# Patient Record
Sex: Female | Born: 1995 | Race: White | Hispanic: No | Marital: Single | Smoking: Never smoker
Health system: Southern US, Community
[De-identification: ages and names within clinical notes are randomized; demographics above are authoritative.]

## PROBLEM LIST (undated history)

## (undated) DIAGNOSIS — K900 Celiac disease: Secondary | ICD-10-CM

## (undated) DIAGNOSIS — E282 Polycystic ovarian syndrome: Secondary | ICD-10-CM

---
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
## 2015-01-03 DIAGNOSIS — R946 Abnormal results of thyroid function studies: Secondary | ICD-10-CM | POA: Insufficient documentation

## 2015-01-03 DIAGNOSIS — E559 Vitamin D deficiency, unspecified: Secondary | ICD-10-CM | POA: Insufficient documentation

## 2015-04-14 ENCOUNTER — Emergency Department
Admission: EM | Admit: 2015-04-14 | Discharge: 2015-04-14 | Disposition: A | Discharge status: Home / Self Care | Payer: BLUE CROSS/BLUE SHIELD | Attending: Emergency Medicine | Admitting: Emergency Medicine

## 2015-04-14 ENCOUNTER — Emergency Department: Payer: BLUE CROSS/BLUE SHIELD

## 2015-04-14 DIAGNOSIS — R1110 Vomiting, unspecified: Secondary | ICD-10-CM | POA: Insufficient documentation

## 2015-04-14 DIAGNOSIS — Z79899 Other long term (current) drug therapy: Secondary | ICD-10-CM | POA: Insufficient documentation

## 2015-04-14 DIAGNOSIS — R102 Pelvic and perineal pain: Secondary | ICD-10-CM | POA: Insufficient documentation

## 2015-04-14 DIAGNOSIS — Z791 Long term (current) use of non-steroidal anti-inflammatories (NSAID): Secondary | ICD-10-CM | POA: Diagnosis not present

## 2015-04-14 DIAGNOSIS — Z3202 Encounter for pregnancy test, result negative: Secondary | ICD-10-CM | POA: Diagnosis not present

## 2015-04-14 DIAGNOSIS — G8929 Other chronic pain: Secondary | ICD-10-CM | POA: Insufficient documentation

## 2015-04-14 DIAGNOSIS — R1030 Lower abdominal pain, unspecified: Secondary | ICD-10-CM | POA: Diagnosis present

## 2015-04-14 LAB — URINALYSIS COMPLETE WITH MICROSCOPIC (ARMC ONLY)
BILIRUBIN URINE: NEGATIVE
Glucose, UA: NEGATIVE mg/dL
HGB URINE DIPSTICK: NEGATIVE
KETONES UR: NEGATIVE mg/dL
LEUKOCYTES UA: NEGATIVE
Nitrite: NEGATIVE
Protein, ur: NEGATIVE mg/dL
RBC / HPF: NONE SEEN RBC/hpf (ref 0–5)
SPECIFIC GRAVITY, URINE: 1.023 (ref 1.005–1.030)
pH: 5 (ref 5.0–8.0)

## 2015-04-14 LAB — COMPREHENSIVE METABOLIC PANEL
ALBUMIN: 4.1 g/dL (ref 3.5–5.0)
ALK PHOS: 50 U/L (ref 38–126)
ALT: 14 U/L (ref 14–54)
AST: 14 U/L — AB (ref 15–41)
Anion gap: 6 (ref 5–15)
BILIRUBIN TOTAL: 0.4 mg/dL (ref 0.3–1.2)
BUN: 13 mg/dL (ref 6–20)
CALCIUM: 9.5 mg/dL (ref 8.9–10.3)
CO2: 25 mmol/L (ref 22–32)
Chloride: 110 mmol/L (ref 101–111)
Creatinine, Ser: 0.61 mg/dL (ref 0.44–1.00)
GFR calc Af Amer: >60 mL/min (ref >60)
GFR calc non Af Amer: >60 mL/min (ref >60)
GLUCOSE: 95 mg/dL (ref 65–99)
POTASSIUM: 3.6 mmol/L (ref 3.5–5.1)
SODIUM: 141 mmol/L (ref 135–145)
Total Protein: 7.5 g/dL (ref 6.5–8.1)

## 2015-04-14 LAB — CBC WITH DIFFERENTIAL/PLATELET
BASOS PCT: 1 %
Basophils Absolute: 0 10*3/uL (ref 0–0.1)
EOS PCT: 2 %
Eosinophils Absolute: 0.1 10*3/uL (ref 0–0.7)
HEMATOCRIT: 40.6 % (ref 35.0–47.0)
HEMOGLOBIN: 13.6 g/dL (ref 12.0–16.0)
LYMPHS PCT: 52 %
Lymphs Abs: 2.3 10*3/uL (ref 1.0–3.6)
MCH: 29.4 pg (ref 26.0–34.0)
MCHC: 33.5 g/dL (ref 32.0–36.0)
MCV: 87.9 fL (ref 80.0–100.0)
MONO ABS: 0.3 10*3/uL (ref 0.2–0.9)
MONOS PCT: 7 %
Neutro Abs: 1.7 10*3/uL (ref 1.4–6.5)
Neutrophils Relative %: 38 %
Platelets: 194 10*3/uL (ref 150–440)
RBC: 4.62 MIL/uL (ref 3.80–5.20)
RDW: 12.1 % (ref 11.5–14.5)
WBC: 4.6 10*3/uL (ref 3.6–11.0)

## 2015-04-14 LAB — WET PREP, GENITAL
Clue Cells Wet Prep HPF POC: NONE SEEN
Trich, Wet Prep: NONE SEEN
Yeast Wet Prep HPF POC: NONE SEEN

## 2015-04-14 LAB — CHLAMYDIA/NGC RT PCR (ARMC ONLY)
CHLAMYDIA TR: NOT DETECTED
N GONORRHOEAE: NOT DETECTED

## 2015-04-14 LAB — POCT PREGNANCY, URINE: Preg Test, Ur: NEGATIVE

## 2015-04-14 LAB — HCG, QUANTITATIVE, PREGNANCY

## 2015-04-14 LAB — LIPASE, BLOOD: LIPASE: 21 U/L — AB (ref 22–51)

## 2015-04-14 MED ORDER — MORPHINE SULFATE (PF) 2 MG/ML IV SOLN
2.0000 mg | Freq: Once | INTRAVENOUS | Status: AC
  Administered 2015-04-14: 2 mg via INTRAVENOUS
  Filled 2015-04-14: qty 1

## 2015-04-14 MED ORDER — OXYCODONE-ACETAMINOPHEN 5-325 MG PO TABS
1.0000 | ORAL_TABLET | Freq: Once | ORAL | Status: AC
  Administered 2015-04-14: 1 via ORAL
  Filled 2015-04-14: qty 1

## 2015-04-14 NOTE — Discharge Instructions (Signed)
We do noticed that you have reassuring blood work and ultrasound today. There is no evidence of acute surgical emergency. If, however, things worsen including increased pain, vomiting, fever, chills, or you feel worse in any significant way we ask you  to come back to the emergency department right away. The ultrasound findings I have included below; it is unclear exactly what this signifies, radiology is requesting that you have a repeat ultrasound in about 6 weeks. Please follow closely with OB/GYN for further evaluation. There is nothing to suggest that this is what is causing your pain.  Abnormal echogenic material in the cervical canal and endometrium of the lower uterine segment, without internal Doppler flow, probably representing blood clot. Urine pregnancy test today was negative, accordingly I doubt that this represents a spontaneous abortion in progress. Polyp is also a less likely differential diagnostic consideration given the elongated and somewhat irregular appearance and lack of internal flow. If the patient experiences abnormal uterine bleeding or other continued gynecologic symptoms despite conservative therapy, then reimaging of the pelvis in 6 weeks time may be warranted for reassessment.

## 2015-04-14 NOTE — ED Notes (Signed)
Pt resting in bed quietly, friend at bedside, respirations even and unlabored

## 2015-04-14 NOTE — ED Notes (Addendum)
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

## 2015-04-14 NOTE — ED Provider Notes (Addendum)
[facility] Emergency Department Provider Note  ____________________________________________   I have reviewed the triage vital signs and the nursing notes.   HISTORY  Chief Complaint Abdominal Pain    HPI Lori Beck is a 19 y.o. female with a history of chronic abdominal pain. Patient gets her care here as well as at various hospitals in [place]. She has a history of celiac disease, and polycystic ovarian syndrome and chronic recurrent abdominal pain. She has had, according to notes, negative CT scan of the abdomen this year, negative CT of the abdomen 2013, negative recent HIDA scan negative recent gallbladder ultrasound, multiple other negative tests for her chronic abdominal pain. She states that today she had some vomiting and lower abdominal pain. The pain is similar to her prior pain. She does have history of PCO as but she has not had any significant ovarian cysts she states because she was put on birth control after was detected "early". 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 Patient has a very medical history capillary and states that she does take "MicroBid postcoitally" to forestall UTIs. She has no urinary tract symptoms at this time. She denies presence event cannot totally when her last menstrual period was because of her birth control. Pain this time started yesterday, gradual in onset, she did vomit several times, she noticed a "trace" of red blood in her vomit once. She denies vaginal discharge or STI exposure. She is driving but she states that we give her pain medication she will arrange a ride home and she understands she must not drive after pain medication here    No past medical history on file.  There are no active problems to display for this patient.   No past surgical history on file.  Current Outpatient Rx  Name  Route  Sig  Dispense  Refill  . DM-Doxylamine-Acetaminophen (NYQUIL COLD & FLU PO)   Oral   Take 15  mLs by mouth at bedtime as needed (cough).         Marland Kitchen. HYDROcodone-acetaminophen (NORCO/VICODIN) 5-325 MG per tablet   Oral   Take 1 tablet by mouth every 4 (four) hours as needed.   10 tablet   0   . naproxen (NAPROSYN) 500 MG tablet   Oral   Take 1 tablet (500 mg total) by mouth 2 (two) times daily.   30 tablet   0   . Norethin Ace-Eth Estrad-FE (GILDESS 24 FE PO)   Oral   Take 1 tablet by mouth at bedtime.           Allergies Review of patient's allergies indicates no known allergies.  No family history on file.  Social History Social History  Substance Use Topics  . Smoking status: Never Smoker   . Smokeless tobacco: Not on file  . Alcohol Use: Yes    Review of Systems Constitutional: No fever/chills Eyes: No visual changes. ENT: No sore throat. No stiff neck no neck pain Cardiovascular: Denies chest pain. Respiratory: Denies shortness of breath. Gastrointestinal:   Positive vomiting.  No diarrhea.  No constipation. Genitourinary: Negative for dysuria. Musculoskeletal: Negative lower extremity swelling Skin: Negative for rash. Neurological: Negative for headaches, focal weakness or numbness. 10-point ROS otherwise negative.  ____________________________________________   PHYSICAL EXAM:  VITAL SIGNS: ED Triage Vitals  Enc Vitals Group     BP 04/14/15 0818 115/65 mmHg     Pulse Rate 04/14/15 0818 84     Resp 04/14/15 0818 18  Temp 04/14/15 0818 98.3 F (36.8 C)     Temp Source 04/14/15 0818 Oral     SpO2 04/14/15 0818 98 %     Weight 04/14/15 0818 160 lb (72.576 kg)     Height 04/14/15 0818  (1.727 m)     Head Cir --      Peak Flow --      Pain Score 04/14/15 0823 8     Pain Loc --      Pain Edu? --      Excl. in GC? --     Constitutional: Alert and oriented. Well appearing and in no acute distress. Using her telephone and smiling with no evidence of discomfort Eyes: Conjunctivae are normal. PERRL. EOMI. Head: Atraumatic. Nose: No  congestion/rhinnorhea. Mouth/Throat: Mucous membranes are moist.  Oropharynx non-erythematous. Neck: No stridor.   Nontender with no meningismus Cardiovascular: Normal rate, regular rhythm. Grossly normal heart sounds.  Good peripheral circulation. Respiratory: Normal respiratory effort.  No retractions. Lungs CTAB. Gastrointestinal: There is distractible trace suprapubic discomfort with soft abdomen good bowel sounds No distention. No guarding no rebound Back:  There is no focal tenderness or step off there is no midline tenderness there are no lesions noted. there is no CVA tenderness Pelvic exam: Female nurse chaperone present, no external lesions noted, physiologic vaginal discharge noted with no purulent discharge, no cervical motion tenderness, no adnexal masses, there is left-sided adnexal discomfort , there is no significant uterine tenderness or mass. No vaginal bleeding Musculoskeletal: No lower extremity tenderness. No joint effusions, no DVT signs strong distal pulses no edema Neurologic:  Normal speech and language. No gross focal neurologic deficits are appreciated.  Skin:  Skin is warm, dry and intact. No rash noted. Psychiatric: Mood and affect are normal. Speech and behavior are normal.  ____________________________________________   LABS (all labs ordered are listed, but only abnormal results are displayed)  Labs Reviewed  WET PREP, GENITAL  CHLAMYDIA/NGC RT PCR ([facility] ONLY)  CBC WITH DIFFERENTIAL/PLATELET  COMPREHENSIVE METABOLIC PANEL  LIPASE, BLOOD  URINALYSIS COMPLETEWITH MICROSCOPIC ([facility] ONLY)  POC URINE PREG, ED  POCT PREGNANCY, URINE   ____________________________________________  EKG   ____________________________________________  RADIOLOGY   ____________________________________________   PROCEDURES  Procedure(s) performed: None  Critical Care performed: None  ____________________________________________   INITIAL IMPRESSION /  ASSESSMENT AND PLAN / ED COURSE  Pertinent labs & imaging results that were available during my care of the patient were reviewed by me and considered in my medical decision making (see chart for details).  Patient very well-appearing with chronic recurrent abdominal pain despite extensive imaging and testing. We will do a pelvic exam because of her history of PCO as although have low suspicion for torsion or significant ruptured cyst given her history and abdominal exam. We will obtain blood work and urine and urine pregnancy, I will give her a small dose of pain medication as she is quite comfortable in appearance and will reassess. ____________________________________________   FINAL CLINICAL IMPRESSION(S) / ED DIAGNOSES  Final diagnoses:  None   ----------------------------------------- 12:12 PM on 04/14/2015 -----------------------------------------  Patient remains in no acute distress, serial abdominal exams are benign workup is unremarkable patient eager to go home tolerating by mouth the patient was made aware of the findings on ultrasound the exact character wrist active which I am not sure how to describe. It looks like she may be beginning her period although I saw no evidence on bimanual or speculum exam. There is no evidence of STI  or PID. I have advised her that she needs to follow-up for these findings and I have given her information about the findings today take with her to see OB. 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 The patient has no complaints at this time and is eager to go. The patient's questions were answered and follow-up instructions and return precautions given and understood. I did check a serum Quant to ensure that this was not a miscarriage and it is negative. No evidence of torsion or PCO S  ----------------------------------------- 12:45 PM on 04/14/2015 -----------------------------------------  Patient  requesting pain meds prior to go encase the pain gets worse again. I will give her one here but I do not wish to mask any pathology and therefore will hold off on setting her home with narcotic prescription. Serial abdominal exams which are no evidence of acute appendicitis or other intra-abdominal pathology but she understands she must come back if she feels worse  Jeanmarie Plant, MD 04/14/15 2841  Jeanmarie Plant, MD 04/14/15 3244  Jeanmarie Plant, MD 04/14/15 1213  Jeanmarie Plant, MD 04/14/15 1245

## 2015-04-14 NOTE — ED Notes (Signed)
Pt reports abdominal pain x 1 day. Pt reports multiple episodes of emesis with blood in emesis this am. Pt reports LLQ abdominal pain

## 2015-04-14 NOTE — ED Notes (Signed)
Patient asking for pain med at this time will nurse.

## 2015-04-14 NOTE — ED Notes (Signed)
Patient transported to Ultrasound 

## 2015-06-08 ENCOUNTER — Emergency Department: Payer: BLUE CROSS/BLUE SHIELD

## 2015-06-08 ENCOUNTER — Emergency Department
Admission: EM | Admit: 2015-06-08 | Discharge: 2015-06-08 | Disposition: A | Discharge status: Home / Self Care | Payer: BLUE CROSS/BLUE SHIELD | Attending: Emergency Medicine | Admitting: Emergency Medicine

## 2015-06-08 ENCOUNTER — Encounter: Payer: Self-pay | Admitting: Emergency Medicine

## 2015-06-08 DIAGNOSIS — E860 Dehydration: Secondary | ICD-10-CM | POA: Diagnosis not present

## 2015-06-08 DIAGNOSIS — Z3202 Encounter for pregnancy test, result negative: Secondary | ICD-10-CM | POA: Diagnosis not present

## 2015-06-08 DIAGNOSIS — J019 Acute sinusitis, unspecified: Secondary | ICD-10-CM | POA: Diagnosis not present

## 2015-06-08 DIAGNOSIS — L293 Anogenital pruritus, unspecified: Secondary | ICD-10-CM | POA: Diagnosis not present

## 2015-06-08 DIAGNOSIS — R531 Weakness: Secondary | ICD-10-CM | POA: Insufficient documentation

## 2015-06-08 DIAGNOSIS — R55 Syncope and collapse: Secondary | ICD-10-CM | POA: Diagnosis present

## 2015-06-08 HISTORY — DX: Celiac disease: K90.0

## 2015-06-08 HISTORY — DX: Polycystic ovarian syndrome: E28.2

## 2015-06-08 LAB — BASIC METABOLIC PANEL
ANION GAP: 6 (ref 5–15)
BUN: 13 mg/dL (ref 6–20)
CALCIUM: 8.9 mg/dL (ref 8.9–10.3)
CO2: 24 mmol/L (ref 22–32)
Chloride: 108 mmol/L (ref 101–111)
Creatinine, Ser: 0.71 mg/dL (ref 0.44–1.00)
Glucose, Bld: 100 mg/dL — ABNORMAL HIGH (ref 65–99)
POTASSIUM: 3.3 mmol/L — AB (ref 3.5–5.1)
Sodium: 138 mmol/L (ref 135–145)

## 2015-06-08 LAB — URINALYSIS COMPLETE WITH MICROSCOPIC (ARMC ONLY)
Bilirubin Urine: NEGATIVE
Glucose, UA: NEGATIVE mg/dL
Hgb urine dipstick: NEGATIVE
Nitrite: NEGATIVE
Protein, ur: 30 mg/dL — AB
Specific Gravity, Urine: 1.033 — ABNORMAL HIGH (ref 1.005–1.030)
pH: 5 (ref 5.0–8.0)

## 2015-06-08 LAB — CBC
HCT: 37.8 % (ref 35.0–47.0)
Hemoglobin: 13.1 g/dL (ref 12.0–16.0)
MCH: 30.8 pg (ref 26.0–34.0)
MCHC: 34.6 g/dL (ref 32.0–36.0)
MCV: 89 fL (ref 80.0–100.0)
Platelets: 223 10*3/uL (ref 150–440)
RBC: 4.24 MIL/uL (ref 3.80–5.20)
RDW: 13 % (ref 11.5–14.5)
WBC: 7.6 10*3/uL (ref 3.6–11.0)

## 2015-06-08 LAB — POCT PREGNANCY, URINE: Preg Test, Ur: NEGATIVE

## 2015-06-08 MED ORDER — FLUCONAZOLE 150 MG PO TABS: 150.0000 mg | ORAL_TABLET | Freq: Every day | ORAL | Status: AC

## 2015-06-08 MED ORDER — AMOXICILLIN-POT CLAVULANATE 875-125 MG PO TABS: 1.0000 | ORAL_TABLET | Freq: Two times a day (BID) | ORAL | Status: AC

## 2015-06-08 MED ORDER — SODIUM CHLORIDE 0.9 % IV SOLN
Freq: Once | INTRAVENOUS | Status: AC
  Administered 2015-06-08: 08:00:00 via INTRAVENOUS

## 2015-06-08 NOTE — ED Notes (Addendum)
Patient ambulatory to triage with steady gait, without difficulty or distress noted; "I feel like my immune system shut down over night; I passed out last night for a minute then went to sleep; awoke at 3am to go to bathroom and woke up on bathroom floor; got a yeast infection, chills, weak, can't walk or talk real well, fever, cold symptoms; not having any visceral pain just like dehydration"

## 2015-06-08 NOTE — ED Provider Notes (Addendum)
[facility] Emergency Department Provider Note     Time seen: ----------------------------------------- 7:15 AM on 06/08/2015 -----------------------------------------    I have reviewed the triage vital signs and the nursing notes.   HISTORY  Chief Complaint Loss of Consciousness and Weakness    HPI Lori Beck is a 19 y.o. female who presents to ER stating she passed out or almost passed out last night several times. She has described vaginal itching, chills, weakness, cough and cold symptoms, facial pressure. Patient feels like she's been eating and drinking normally, denies a history of this in the past.Nothing makes her symptoms better or worse currently.   Past Medical History  Diagnosis Date  . Celiac disease   . Polycystic ovarian disease     There are no active problems to display for this patient.   History reviewed. No pertinent past surgical history.  Allergies Review of patient's allergies indicates no known allergies.  Social History Social History  Substance Use Topics  . Smoking status: Never Smoker   . Smokeless tobacco: None  . Alcohol Use: Yes    Review of Systems Constitutional: Positive for fever Eyes: Negative for visual changes. ENT: Negative for sore throat. Positive for facial pressure and congestion Cardiovascular: Negative for chest pain. Respiratory: Negative for shortness of breath. Positive for cough Gastrointestinal: Negative for abdominal pain, vomiting and diarrhea. Genitourinary: Negative for dysuria. Musculoskeletal: Negative for back pain. Skin: Negative for rash. Neurological: Negative for headaches, positive for weakness  10-point ROS otherwise negative.  ____________________________________________   PHYSICAL EXAM:  VITAL SIGNS: ED Triage Vitals  Enc Vitals Group     BP 06/08/15 0520 148/78 mmHg     Pulse Rate 06/08/15 0520 88     Resp 06/08/15 0520 18     Temp 06/08/15 0520 98 F  (36.7 C)     Temp Source 06/08/15 0520 Oral     SpO2 06/08/15 0520 98 %     Weight 06/08/15 0520 160 lb (72.576 kg)     Height 06/08/15 0520 5\' 8"  (1.727 m)     Head Cir --      Peak Flow --      Pain Score --      Pain Loc --      Pain Edu? --      Excl. in GC? --     Constitutional: Alert and oriented. Well appearing and in no distress. Eyes: Conjunctivae are normal. PERRL. Normal extraocular movements. ENT   Head: Normocephalic and atraumatic. There is tenderness over frontal and maxillary sinuses.   Nose: Mild congestion   Mouth/Throat: Mucous membranes are moist.   Neck: No stridor. Cardiovascular: Normal rate, regular rhythm. Normal and symmetric distal pulses are present in all extremities. No murmurs, rubs, or gallops. Respiratory: Normal respiratory effort without tachypnea nor retractions. Breath sounds are clear and equal bilaterally. No wheezes/rales/rhonchi. Gastrointestinal: Soft and nontender. No distention. No abdominal bruits.  Musculoskeletal: Nontender with normal range of motion in all extremities. No joint effusions.  No lower extremity tenderness nor edema. Neurologic:  Normal speech and language. No gross focal neurologic deficits are appreciated. Speech is normal. No gait instability. Skin:  Skin is warm, dry and intact. No rash noted. Psychiatric: Mood and affect are normal. Speech and behavior are normal. Patient exhibits appropriate insight and judgment. ____________________________________________  EKG: Interpreted by me. Normal sinus rhythm with a rate of 77 bpm, normal PR interval, normal QRS with, normal QT interval. Normal axis.  ____________________________________________  ED COURSE:  Pertinent labs & imaging results that were available during my care of the patient were reviewed by me and considered in my medical decision making (see chart for details). Patient's acute distress, will check basic labs give fluids and  reevaluate. ____________________________________________    LABS (pertinent positives/negatives)  Labs Reviewed  BASIC METABOLIC PANEL - Abnormal; Notable for the following:    Potassium 3.3 (*)    Glucose, Bld 100 (*)    All other components within normal limits  URINALYSIS COMPLETEWITH MICROSCOPIC ([facility] ONLY) - Abnormal; Notable for the following:    Color, Urine YELLOW (*)    APPearance HAZY (*)    Ketones, ur TRACE (*)    Specific Gravity, Urine 1.033 (*)    Protein, ur 30 (*)    Leukocytes, UA 2+ (*)    Bacteria, UA FEW (*)    Squamous Epithelial / LPF 6-30 (*)    All other components within normal limits  CBC  POC URINE PREG, ED  POCT PREGNANCY, URINE    RADIOLOGY  Chest x-ray IMPRESSION: Suggestion of hyperinflation and increased interstitial markings suggesting viral/ atypical respiratory infection. No pleural effusion. ____________________________________________  FINAL ASSESSMENT AND PLAN  Weakness, sinusitis  Plan: Patient with labs and imaging as dictated above. Patient likely mildly dehydrated. She received saline here, she'll be discharged with antibiotics for sinus infection. She stable for outpatient follow-up the next 2 days for reevaluation.   Emily Filbert, MD   Emily Filbert, MD 06/08/15 [id number]  Emily Filbert, MD 06/08/15 [phone]

## 2015-06-08 NOTE — Discharge Instructions (Signed)
Dehydration, Adult °Dehydration is a condition in which you do not have enough fluid or water in your body. It happens when you take in less fluid than you lose. Vital organs such as the kidneys, brain, and heart cannot function without a proper amount of fluids. Any loss of fluids from the body can cause dehydration.  °Dehydration can range from mild to severe. This condition should be treated right away to help prevent it from becoming severe. °CAUSES  °This condition may be caused by: °· Vomiting. °· Diarrhea. °· Excessive sweating, such as when exercising in hot or humid weather. °· Not drinking enough fluid during strenuous exercise or during an illness. °· Excessive urine output. °· Fever. °· Certain medicines. °RISK FACTORS °This condition is more likely to develop in: °· People who are taking certain medicines that cause the body to lose excess fluid (diuretics).   °· People who have a chronic illness, such as diabetes, that may increase urination. °· Older adults.   °· People who live at high altitudes.   °· People who participate in endurance sports.   °SYMPTOMS  °Mild Dehydration °· Thirst. °· Dry lips. °· Slightly dry mouth. °· Dry, warm skin. °Moderate Dehydration °· Very dry mouth.   °· Muscle cramps.   °· Dark urine and decreased urine production.   °· Decreased tear production.   °· Headache.   °· Light-headedness, especially when you stand up from a sitting position.   °Severe Dehydration °· Changes in skin.   °· Cold and clammy skin.   °· Skin does not spring back quickly when lightly pinched and released.   °· Changes in body fluids.   °· Extreme thirst.   °· No tears.   °· Not able to sweat when body temperature is high, such as in hot weather.   °· Minimal urine production.   °· Changes in vital signs.   °· Rapid, weak pulse (more than 100 beats per minute when you are sitting still).   °· Rapid breathing.   °· Low blood pressure.   °· Other changes.   °· Sunken eyes.   °· Cold hands and feet.    °· Confusion. °· Lethargy and difficulty being awakened. °· Fainting (syncope).   °· Short-term weight loss.   °· Unconsciousness. °DIAGNOSIS  °This condition may be diagnosed based on your symptoms. You may also have tests to determine how severe your dehydration is. These tests may include:  °· Urine tests.   °· Blood tests.   °TREATMENT  °Treatment for this condition depends on the severity. Mild or moderate dehydration can often be treated at home. Treatment should be started right away. Do not wait until dehydration becomes severe. Severe dehydration needs to be treated at the hospital. °Treatment for Mild Dehydration °· Drinking plenty of water to replace the fluid you have lost.   °· Replacing minerals in your blood (electrolytes) that you may have lost.   °Treatment for Moderate Dehydration  °· Consuming oral rehydration solution (ORS). °Treatment for Severe Dehydration °· Receiving fluid through an IV tube.   °· Receiving electrolyte solution through a feeding tube that is passed through your nose and into your stomach (nasogastric tube or NG tube). °· Correcting any abnormalities in electrolytes. °HOME CARE INSTRUCTIONS  °· Drink enough fluid to keep your urine clear or pale yellow.   °· Drink water or fluid slowly by taking small sips. You can also try sucking on ice cubes.  °· Have food or beverages that contain electrolytes. Examples include bananas and sports drinks. °· Take over-the-counter and prescription medicines only as told by your health care provider.   °· Prepare ORS according to the manufacturer's instructions. Take sips   of ORS every 5 minutes until your urine returns to normal. °· If you have vomiting or diarrhea, continue to try to drink water, ORS, or both.   °· If you have diarrhea, avoid:   °¨ Beverages that contain caffeine.   °¨ Fruit juice.   °¨ Milk.    °¨ Carbonated soft drinks. °· Do not take salt tablets. This can lead to the condition of having too much sodium in your body  (hypernatremia).   °SEEK MEDICAL CARE IF: °· You cannot eat or drink without vomiting. °· You have had moderate diarrhea during a period of more than 24 hours. °· You have a fever. °SEEK IMMEDIATE MEDICAL CARE IF:  °· You have extreme thirst. °· You have severe diarrhea. °· You have not urinated in 6-8 hours, or you have urinated only a small amount of very dark urine. °· You have shriveled skin. °· You are dizzy, confused, or both. °  °This information is not intended to replace advice given to you by your health care provider. Make sure you discuss any questions you have with your health care provider. °  °Document Released: 06/19/2005 Document Revised: 03/10/2015 Document Reviewed: 11/04/2014 °Elsevier Interactive Patient Education ©2016 [employer] °Sinusitis, Adult °Sinusitis is redness, soreness, and inflammation of the paranasal sinuses. Paranasal sinuses are air pockets within the bones of your face. They are located beneath your eyes, in the middle of your forehead, and above your eyes. In healthy paranasal sinuses, mucus is able to drain out, and air is able to circulate through them by way of your nose. However, when your paranasal sinuses are inflamed, mucus and air can become trapped. This can allow bacteria and other germs to grow and cause infection. °Sinusitis can develop quickly and last only a short time (acute) or continue over a long period (chronic). Sinusitis that lasts for more than 12 weeks is considered chronic. °CAUSES °Causes of sinusitis include: °· Allergies. °· Structural abnormalities, such as displacement of the cartilage that separates your nostrils (deviated septum), which can decrease the air flow through your nose and sinuses and affect sinus drainage. °· Functional abnormalities, such as when the small hairs (cilia) that line your sinuses and help remove mucus do not work properly or are not present. °SIGNS AND SYMPTOMS °Symptoms of acute and chronic sinusitis are the same. The  primary symptoms are pain and pressure around the affected sinuses. Other symptoms include: °· Upper toothache. °· Earache. °· Headache. °· Bad breath. °· Decreased sense of smell and taste. °· A cough, which worsens when you are lying flat. °· Fatigue. °· Fever. °· Thick drainage from your nose, which often is green and may contain pus (purulent). °· Swelling and warmth over the affected sinuses. °DIAGNOSIS °Your health care provider will perform a physical exam. During your exam, your health care provider may perform any of the following to help determine if you have acute sinusitis or chronic sinusitis: °· Look in your nose for signs of abnormal growths in your nostrils (nasal polyps). °· Tap over the affected sinus to check for signs of infection. °· View the inside of your sinuses using an imaging device that has a light attached (endoscope). °If your health care provider suspects that you have chronic sinusitis, one or more of the following tests may be recommended: °· Allergy tests. °· Nasal culture. A sample of mucus is taken from your nose, sent to a lab, and screened for bacteria. °· Nasal cytology. A sample of mucus is taken from your nose and examined by   your health care provider to determine if your sinusitis is related to an allergy. °TREATMENT °Most cases of acute sinusitis are related to a viral infection and will resolve on their own within 10 days. Sometimes, medicines are prescribed to help relieve symptoms of both acute and chronic sinusitis. These may include pain medicines, decongestants, nasal steroid sprays, or saline sprays. °However, for sinusitis related to a bacterial infection, your health care provider will prescribe antibiotic medicines. These are medicines that will help kill the bacteria causing the infection. °Rarely, sinusitis is caused by a fungal infection. In these cases, your health care provider will prescribe antifungal medicine. °For some cases of chronic sinusitis, surgery  is needed. Generally, these are cases in which sinusitis recurs more than 3 times per year, despite other treatments. °HOME CARE INSTRUCTIONS °· Drink plenty of water. Water helps thin the mucus so your sinuses can drain more easily. °· Use a humidifier. °· Inhale steam 3-4 times a day (for example, sit in the bathroom with the shower running). °· Apply a warm, moist washcloth to your face 3-4 times a day, or as directed by your health care provider. °· Use saline nasal sprays to help moisten and clean your sinuses. °· Take medicines only as directed by your health care provider. °· If you were prescribed either an antibiotic or antifungal medicine, finish it all even if you start to feel better. °SEEK IMMEDIATE MEDICAL CARE IF: °· You have increasing pain or severe headaches. °· You have nausea, vomiting, or drowsiness. °· You have swelling around your face. °· You have vision problems. °· You have a stiff neck. °· You have difficulty breathing. °  °This information is not intended to replace advice given to you by your health care provider. Make sure you discuss any questions you have with your health care provider. °  °Document Released: 06/19/2005 Document Revised: 07/10/2014 Document Reviewed: 07/04/2011 °Elsevier Interactive Patient Education ©2016 [employer] Inc. ° °

## 2015-11-06 ENCOUNTER — Encounter: Payer: Self-pay | Admitting: Emergency Medicine

## 2015-11-06 ENCOUNTER — Emergency Department
Admission: EM | Admit: 2015-11-06 | Discharge: 2015-11-06 | Disposition: A | Discharge status: Home / Self Care | Payer: BLUE CROSS/BLUE SHIELD | Attending: Student | Admitting: Student

## 2015-11-06 DIAGNOSIS — N390 Urinary tract infection, site not specified: Secondary | ICD-10-CM | POA: Diagnosis not present

## 2015-11-06 DIAGNOSIS — R112 Nausea with vomiting, unspecified: Secondary | ICD-10-CM | POA: Diagnosis present

## 2015-11-06 DIAGNOSIS — N760 Acute vaginitis: Secondary | ICD-10-CM | POA: Insufficient documentation

## 2015-11-06 LAB — COMPREHENSIVE METABOLIC PANEL
ALK PHOS: 46 U/L (ref 38–126)
ALT: 14 U/L (ref 14–54)
ANION GAP: 8 (ref 5–15)
AST: 15 U/L (ref 15–41)
Albumin: 3.8 g/dL (ref 3.5–5.0)
BUN: 15 mg/dL (ref 6–20)
CALCIUM: 9.2 mg/dL (ref 8.9–10.3)
CO2: 26 mmol/L (ref 22–32)
Chloride: 106 mmol/L (ref 101–111)
Creatinine, Ser: 0.59 mg/dL (ref 0.44–1.00)
GFR calc Af Amer: >60 mL/min (ref >60)
GFR calc non Af Amer: >60 mL/min (ref >60)
GLUCOSE: 99 mg/dL (ref 65–99)
Potassium: 3.7 mmol/L (ref 3.5–5.1)
Sodium: 140 mmol/L (ref 135–145)
Total Bilirubin: 0.7 mg/dL (ref 0.3–1.2)
Total Protein: 6.9 g/dL (ref 6.5–8.1)

## 2015-11-06 LAB — URINALYSIS COMPLETE WITH MICROSCOPIC (ARMC ONLY)
Bilirubin Urine: NEGATIVE
Glucose, UA: NEGATIVE mg/dL
Hgb urine dipstick: NEGATIVE
KETONES UR: NEGATIVE mg/dL
NITRITE: NEGATIVE
PH: 5 (ref 5.0–8.0)
PROTEIN: 30 mg/dL — AB
SPECIFIC GRAVITY, URINE: 1.028 (ref 1.005–1.030)

## 2015-11-06 LAB — CBC WITH DIFFERENTIAL/PLATELET
Basophils Absolute: 0.1 10*3/uL (ref 0–0.1)
Basophils Relative: 1 %
EOS PCT: 2 %
Eosinophils Absolute: 0.1 10*3/uL (ref 0–0.7)
HCT: 38.5 % (ref 35.0–47.0)
Hemoglobin: 13 g/dL (ref 12.0–16.0)
LYMPHS ABS: 2.3 10*3/uL (ref 1.0–3.6)
LYMPHS PCT: 32 %
MCH: 30.3 pg (ref 26.0–34.0)
MCHC: 33.7 g/dL (ref 32.0–36.0)
MCV: 89.9 fL (ref 80.0–100.0)
MONO ABS: 0.5 10*3/uL (ref 0.2–0.9)
MONOS PCT: 6 %
Neutro Abs: 4.2 10*3/uL (ref 1.4–6.5)
Neutrophils Relative %: 59 %
PLATELETS: 235 10*3/uL (ref 150–440)
RBC: 4.28 MIL/uL (ref 3.80–5.20)
RDW: 12.7 % (ref 11.5–14.5)
WBC: 7.1 10*3/uL (ref 3.6–11.0)

## 2015-11-06 LAB — POCT PREGNANCY, URINE: Preg Test, Ur: NEGATIVE

## 2015-11-06 LAB — WET PREP, GENITAL
Clue Cells Wet Prep HPF POC: NONE SEEN
SPERM: NONE SEEN
Trich, Wet Prep: NONE SEEN
YEAST WET PREP: NONE SEEN

## 2015-11-06 LAB — CHLAMYDIA/NGC RT PCR (ARMC ONLY)
Chlamydia Tr: NOT DETECTED
N GONORRHOEAE: NOT DETECTED

## 2015-11-06 LAB — LIPASE, BLOOD: Lipase: 18 U/L (ref 11–51)

## 2015-11-06 MED ORDER — ONDANSETRON 4 MG PO TBDP: 4.0000 mg | ORAL_TABLET | Freq: Three times a day (TID) | ORAL | Status: AC | PRN

## 2015-11-06 MED ORDER — OXYCODONE-ACETAMINOPHEN 5-325 MG PO TABS
1.0000 | ORAL_TABLET | Freq: Once | ORAL | Status: AC
  Administered 2015-11-06: 1 via ORAL
  Filled 2015-11-06: qty 1

## 2015-11-06 MED ORDER — ONDANSETRON 4 MG PO TBDP
4.0000 mg | ORAL_TABLET | Freq: Once | ORAL | Status: AC
  Administered 2015-11-06: 4 mg via ORAL
  Filled 2015-11-06: qty 1

## 2015-11-06 MED ORDER — NITROFURANTOIN MONOHYD MACRO 100 MG PO CAPS: 100.0000 mg | ORAL_CAPSULE | Freq: Two times a day (BID) | ORAL | Status: AC

## 2015-11-06 MED ORDER — FLUCONAZOLE 150 MG PO TABS: 150.0000 mg | ORAL_TABLET | Freq: Once | ORAL | Status: AC

## 2015-11-06 NOTE — ED Provider Notes (Signed)
[facility] Emergency Department Provider Note   ____________________________________________  Time seen: Approximately 7:44 AM  I have reviewed the triage vital signs and the nursing notes.   HISTORY  Chief Complaint Emesis and Shaking    HPI Lori Beck is a 20 y.o. female with history of polycystic ovarian syndrome, celiac disease who presents for evaluation of vaginal pain since yesterday, gradual onset, constant since onset, severe. Patient reports that she began having vaginal itching/discomfort yesterday which felt similar to prior yeast infections. She has been treating it with Azo and Monistat. Today she woke with severe vaginal pain and had 4 episodes of nonbloody nonbilious emesis. She denies any abdominal pain, reporting that she thinks the pain from her vagina triggered the vomiting. She has felt "shaky today" but denies any chills or fevers. She thinks this may be secondary to her eating gluten a few days ago. No chest pain or difficulty breathing. No history of sexually transmitted infection.   Past Medical History  Diagnosis Date  . Celiac disease   . Polycystic ovarian disease     There are no active problems to display for this patient.   History reviewed. No pertinent past surgical history.  Current Outpatient Rx  Name  Route  Sig  Dispense  Refill  . JUNEL 1.5/30 1.5-30 MG-MCG tablet   Oral   Take 1 tablet by mouth at bedtime.      1     Dispense as written.   . fluconazole (DIFLUCAN) 150 MG tablet   Oral   Take 1 tablet (150 mg total) by mouth daily.   1 tablet   1   . HYDROcodone-acetaminophen (NORCO/VICODIN) 5-325 MG per tablet   Oral   Take 1 tablet by mouth every 4 (four) hours as needed.   10 tablet   0   . naproxen (NAPROSYN) 500 MG tablet   Oral   Take 1 tablet (500 mg total) by mouth 2 (two) times daily.   30 tablet   0     Allergies Review of patient's allergies indicates no known  allergies.  History reviewed. No pertinent family history.  Social History Social History  Substance Use Topics  . Smoking status: Never Smoker   . Smokeless tobacco: None  . Alcohol Use: Yes    Review of Systems Constitutional: No fever/chills Eyes: No visual changes. ENT: No sore throat. Cardiovascular: Denies chest pain. Respiratory: Denies shortness of breath. Gastrointestinal: No abdominal pain.  +nausea, + vomiting.  No diarrhea.  No constipation. Genitourinary: Negative for dysuria. Musculoskeletal: Negative for back pain. Skin: Negative for rash. Neurological: Negative for headaches, focal weakness or numbness.  10-point ROS otherwise negative.  ____________________________________________   PHYSICAL EXAM:  VITAL SIGNS: ED Triage Vitals  Enc Vitals Group     BP 11/06/15 0734 135/86 mmHg     Pulse Rate 11/06/15 0734 85     Resp 11/06/15 0734 18     Temp 11/06/15 0734 98.1 F (36.7 C)     Temp Source 11/06/15 0734 Oral     SpO2 11/06/15 0734 98 %     Weight 11/06/15 0734 165 lb (74.844 kg)     Height 11/06/15 0734  (1.727 m)     Head Cir --      Peak Flow --      Pain Score 11/06/15 0735 8     Pain Loc --      Pain Edu? --      Excl. in  GC? --     Constitutional: Alert and oriented. Well appearing and in no acute distress. Eyes: Conjunctivae are normal. PERRL. EOMI. Head: Atraumatic. Nose: No congestion/rhinnorhea. Mouth/Throat: Mucous membranes are moist.  Oropharynx non-erythematous. Neck: No stridor. Supple without meningismus. Cardiovascular: Normal rate, regular rhythm. Grossly normal heart sounds.  Good peripheral circulation. Respiratory: Normal respiratory effort.  No retractions. Lungs CTAB. Gastrointestinal: Soft and nontender. No distention. No CVA tenderness. Pelvic: Thick white heterogeneous discharge from a closed os, the vaginal mucosa appears irritated. No bimanual or cervical motion tenderness. Musculoskeletal: No lower  extremity tenderness nor edema.  No joint effusions. Neurologic:  Normal speech and language. No gross focal neurologic deficits are appreciated. No gait instability. Skin:  Skin is warm, dry and intact. No rash noted. Psychiatric: Mood and affect are normal. Speech and behavior are normal.  ____________________________________________   LABS (all labs ordered are listed, but only abnormal results are displayed)  Labs Reviewed  WET PREP, GENITAL - Abnormal; Notable for the following:    WBC, Wet Prep HPF POC MANY (*)    All other components within normal limits  URINALYSIS COMPLETEWITH MICROSCOPIC ([facility] ONLY) - Abnormal; Notable for the following:    Color, Urine YELLOW (*)    APPearance CLOUDY (*)    Protein, ur 30 (*)    Leukocytes, UA 3+ (*)    Bacteria, UA RARE (*)    Squamous Epithelial / LPF 6-30 (*)    All other components within normal limits  CHLAMYDIA/NGC RT PCR ([facility] ONLY)  URINE CULTURE  CBC WITH DIFFERENTIAL/PLATELET  COMPREHENSIVE METABOLIC PANEL  LIPASE, BLOOD  POC URINE PREG, ED  POCT PREGNANCY, URINE   ____________________________________________  EKG  none ____________________________________________  RADIOLOGY  none ____________________________________________   PROCEDURES  Procedure(s) performed: None  Critical Care performed: No  ____________________________________________   INITIAL IMPRESSION / ASSESSMENT AND PLAN / ED COURSE  Pertinent labs & imaging results that were available during my care of the patient were reviewed by me and considered in my medical decision making (see chart for details).  Lori Beck is a 20 y.o. female with history of polycystic ovarian syndrome, celiac disease who presents for evaluation of vaginal pain since yesterday as well as resolved vomiting today. On exam, she is very well-appearing and in no acute distress. Her vital signs are stable, she is afebrile. She is a benign abdominal examination.  Her pelvic exam is clinically concerning for a yeast infection  However her wet prep is negative for yeast. Perhaps this is related to insufficient specimen election. I will treat her with Diflucan. Emergency test is negative. CBC, CMP, lipase unremarkable. Urinalysis is pending. GC chlamydia is also pending but will not result today. Her wet prep is generally unremarkable with the exception of multiple white blood cells. We'll treat her pain and nausea and reassess for disposition.  ----------------------------------------- 9:05 AM on 11/06/2015 -----------------------------------------  Patient continues to appear well, she reports significant improvement of her pain at this time. She is tolerating by mouth intake without vomiting. Urinalysis with 3+ leukocytes, multiple white blood cells, but also 6-30 squamous cells. This may be contaminated given multiple squamous cells and given her vaginal discharge but given that she has also had vomiting and dysuria, we'll treat with Macrobid for urinary tract infection. Urine cultures pending. Discussed return precautions, need for close PCP follow-up and she is comfortable with the discharge plan. DC home. ____________________________________________   FINAL CLINICAL IMPRESSION(S) / ED DIAGNOSES  Final diagnoses:  Non-intractable vomiting with nausea,  vomiting of unspecified type  Vaginitis  UTI (lower urinary tract infection)      NEW MEDICATIONS STARTED DURING THIS VISIT:  New Prescriptions   No medications on file     Note:  This document was prepared using Dragon voice recognition software and may include unintentional dictation errors.    Gayla Doss, MD 11/06/15 [phone]

## 2015-11-06 NOTE — ED Notes (Addendum)
Pt states yesterday she had yeast infection sxs which she treated with AZO tabs and monistat. Woke up this morning with vomiting x4 and pt states she has been shaking as well. Pt states she has celiac disease and ate gluten 3 days ago which she states makes her immune system compromised.

## 2015-11-07 ENCOUNTER — Emergency Department
Admission: EM | Admit: 2015-11-07 | Discharge: 2015-11-07 | Disposition: A | Discharge status: Home / Self Care | Payer: BLUE CROSS/BLUE SHIELD | Attending: Student | Admitting: Student

## 2015-11-07 DIAGNOSIS — R102 Pelvic and perineal pain: Secondary | ICD-10-CM | POA: Insufficient documentation

## 2015-11-07 MED ORDER — TRAMADOL HCL 50 MG PO TABS: 50.0000 mg | ORAL_TABLET | Freq: Three times a day (TID) | ORAL | Status: AC | PRN

## 2015-11-07 NOTE — ED Provider Notes (Signed)
[facility] Emergency Department Provider Note   ____________________________________________  Time seen: Approximately 8:32 AM  I have reviewed the triage vital signs and the nursing notes.   HISTORY  Chief Complaint Vaginal Pain and Abdominal Pain    HPI Lori Beck is a 20 y.o. female  polycystic ovarian syndrome, celiac disease who presents for evaluation of vaginal pain for 2 days, seen by me in this emergency department yesterday for the same complaint and diagnosed with UTI as well as yeast infection. She reports that she felt better after she left the ER yesterday, her pain was controlled after she received a Percocet however this point she awoke from sleep with severe vaginal itching and pain. She has not filled any of her prescriptions for antibiotics or antifungals. She says that she tried over-the-counter medicines to help her with pain but cannot tell me exactly what she took. She has had no fever. She's had no vomiting or nausea since yesterday, she adamantly denies any abdominal pain, no back pain. She specifically requesting a stronger pain medicine for her vaginal pain.   Past Medical History  Diagnosis Date  . Celiac disease   . Polycystic ovarian disease     There are no active problems to display for this patient.   No past surgical history on file.  Current Outpatient Rx  Name  Route  Sig  Dispense  Refill  . fluconazole (DIFLUCAN) 150 MG tablet   Oral   Take 1 tablet (150 mg total) by mouth once.   1 tablet   0   . JUNEL 1.5/30 1.5-30 MG-MCG tablet   Oral   Take 1 tablet by mouth at bedtime.      1     Dispense as written.   . nitrofurantoin, macrocrystal-monohydrate, (MACROBID) 100 MG capsule   Oral   Take 1 capsule (100 mg total) by mouth 2 (two) times daily.   10 capsule   0   . ondansetron (ZOFRAN ODT) 4 MG disintegrating tablet   Oral   Take 1 tablet (4 mg total) by mouth every 8 (eight) hours as needed for  nausea or vomiting.   7 tablet   0   . fluconazole (DIFLUCAN) 150 MG tablet   Oral   Take 1 tablet (150 mg total) by mouth daily.   1 tablet   1   . HYDROcodone-acetaminophen (NORCO/VICODIN) 5-325 MG per tablet   Oral   Take 1 tablet by mouth every 4 (four) hours as needed.   10 tablet   0   . naproxen (NAPROSYN) 500 MG tablet   Oral   Take 1 tablet (500 mg total) by mouth 2 (two) times daily.   30 tablet   0   . traMADol (ULTRAM) 50 MG tablet   Oral   Take 1 tablet (50 mg total) by mouth every 8 (eight) hours as needed. Do not drive while taking this medication.   10 tablet   0     Allergies Review of patient's allergies indicates no known allergies.  No family history on file.  Social History Social History  Substance Use Topics  . Smoking status: Never Smoker   . Smokeless tobacco: Not on file  . Alcohol Use: Yes    Review of Systems Constitutional: No fever/chills Eyes: No visual changes. ENT: No sore throat. Cardiovascular: Denies chest pain. Respiratory: Denies shortness of breath. Gastrointestinal: No abdominal pain.  No nausea, no vomiting.  No diarrhea.  No constipation. Genitourinary:  Negative for dysuria. Musculoskeletal: Negative for back pain. Skin: Negative for rash. Neurological: Negative for headaches, focal weakness or numbness.  10-point ROS otherwise negative.  ____________________________________________   PHYSICAL EXAM:  Filed Vitals:   11/07/15 0842  BP: 123/67  Pulse: 74  Temp: 98.3 F (36.8 C)  TempSrc: Oral  Resp: 16  Height: 5\' 8"  (1.727 m)  Weight: 165 lb (74.844 kg)  SpO2: 99%    VITAL SIGNS: ED Triage Vitals  Enc Vitals Group     BP --      Pulse --      Resp --      Temp --      Temp src --      SpO2 --      Weight --      Height --      Head Cir --      Peak Flow --      Pain Score 11/07/15 0720 10     Pain Loc --      Pain Edu? --      Excl. in GC? --     Constitutional: Alert and oriented.  Well appearing and in no acute distress, Sitting up in bed, Texting on her smart phone. Eyes: Conjunctivae are normal. PERRL. EOMI. Head: Atraumatic. Nose: No congestion/rhinnorhea. Mouth/Throat: Mucous membranes are moist.  Oropharynx non-erythematous. Neck: No stridor. Cardiovascular: Normal rate, regular rhythm. Grossly normal heart sounds.  Good peripheral circulation. Respiratory: Normal respiratory effort.  No retractions. Lungs CTAB. Gastrointestinal: Soft and nontender. No distention.  No CVA tenderness. Genitourinary: deferred Musculoskeletal: No lower extremity tenderness nor edema.  No joint effusions. Neurologic:  Normal speech and language. No gross focal neurologic deficits are appreciated. No gait instability. Skin:  Skin is warm, dry and intact. No rash noted. Psychiatric: Mood and affect are normal. Speech and behavior are normal.  ____________________________________________   LABS (all labs ordered are listed, but only abnormal results are displayed)  Labs Reviewed - No data to display ____________________________________________  EKG  none ____________________________________________  RADIOLOGY  none ____________________________________________   PROCEDURES  Procedure(s) performed: None  Critical Care performed: No  ____________________________________________   INITIAL IMPRESSION / ASSESSMENT AND PLAN / ED COURSE  Pertinent labs & imaging results that were available during my care of the patient were reviewed by me and considered in my medical decision making (see chart for details).  Lori Beck is a 20 y.o. female  polycystic ovarian syndrome, celiac disease who presents for evaluation of vaginal pain for 2 days, seen by me and diagnosed with UTI and yeast infection. On exam, she is very well-appearing and in no acute distress. Her vital signs are stable, she is afebrile. She has a benign physical examination. In truth, she appears quite  comfortable however I discussed with her that I will send her home with a few tramadol and she needs to fill the prescriptions for the medicines that I prescribed her yesterday. She has no signs/symptoms that would suggest sepsis or any acute life-threatening pathology. Her GC chlamydia from yesterday were negative. Discussed return precautions, need for close follow-up and she is comfortable with the discharge plan. DC home. ____________________________________________   FINAL CLINICAL IMPRESSION(S) / ED DIAGNOSES  Final diagnoses:  Vaginal pain      NEW MEDICATIONS STARTED DURING THIS VISIT:  New Prescriptions   TRAMADOL (ULTRAM) 50 MG TABLET    Take 1 tablet (50 mg total) by mouth every 8 (eight) hours as needed. Do not drive while taking  this medication.     Note:  This document was prepared using Dragon voice recognition software and may include unintentional dictation errors.    Gayla DossEryka A Darcy Barbara, MD 11/07/15 [phone]

## 2015-11-07 NOTE — ED Notes (Addendum)
Pt states she was seen yesterday and diagnosed with yeast infection and UTI. Continued pain that is getting worse was given Percocet while here yesterday but the pain has come back. Wanting a prescription for pain medication

## 2015-11-08 LAB — URINE CULTURE

## 2016-07-01 ENCOUNTER — Encounter: Payer: Self-pay | Admitting: Emergency Medicine

## 2016-07-01 DIAGNOSIS — Z79899 Other long term (current) drug therapy: Secondary | ICD-10-CM | POA: Insufficient documentation

## 2016-07-01 DIAGNOSIS — R109 Unspecified abdominal pain: Secondary | ICD-10-CM | POA: Insufficient documentation

## 2016-07-01 DIAGNOSIS — R112 Nausea with vomiting, unspecified: Secondary | ICD-10-CM | POA: Diagnosis not present

## 2016-07-01 LAB — COMPREHENSIVE METABOLIC PANEL
ALK PHOS: 55 U/L (ref 38–126)
ALT: 15 U/L (ref 14–54)
AST: 21 U/L (ref 15–41)
Albumin: 4 g/dL (ref 3.5–5.0)
Anion gap: 8 (ref 5–15)
BILIRUBIN TOTAL: 0.6 mg/dL (ref 0.3–1.2)
BUN: 13 mg/dL (ref 6–20)
CALCIUM: 9 mg/dL (ref 8.9–10.3)
CHLORIDE: 106 mmol/L (ref 101–111)
CO2: 25 mmol/L (ref 22–32)
CREATININE: 0.56 mg/dL (ref 0.44–1.00)
GFR calc Af Amer: >60 mL/min (ref >60)
Glucose, Bld: 122 mg/dL — ABNORMAL HIGH (ref 65–99)
Potassium: 3.4 mmol/L — ABNORMAL LOW (ref 3.5–5.1)
Sodium: 139 mmol/L (ref 135–145)
Total Protein: 7.5 g/dL (ref 6.5–8.1)

## 2016-07-01 LAB — CBC
HCT: 40.5 % (ref 35.0–47.0)
Hemoglobin: 14.2 g/dL (ref 12.0–16.0)
MCH: 30.8 pg (ref 26.0–34.0)
MCHC: 35.1 g/dL (ref 32.0–36.0)
MCV: 87.8 fL (ref 80.0–100.0)
PLATELETS: 230 10*3/uL (ref 150–440)
RBC: 4.61 MIL/uL (ref 3.80–5.20)
RDW: 12 % (ref 11.5–14.5)
WBC: 8.6 10*3/uL (ref 3.6–11.0)

## 2016-07-01 LAB — LIPASE, BLOOD: LIPASE: 16 U/L (ref 11–51)

## 2016-07-01 MED ORDER — ONDANSETRON 4 MG PO TBDP
4.0000 mg | ORAL_TABLET | Freq: Once | ORAL | Status: AC | PRN
  Administered 2016-07-01: 4 mg via ORAL

## 2016-07-01 MED ORDER — ONDANSETRON 4 MG PO TBDP
ORAL_TABLET | ORAL | Status: AC
  Filled 2016-07-01: qty 1

## 2016-07-01 NOTE — ED Notes (Signed)
Pt unable to void at this time but understands specimen is needed

## 2016-07-01 NOTE — ED Triage Notes (Signed)
Pt says she woke this am with nausea and pain to lower abd that radiates across lower abd; cramping/burning sensation; vomited once earlier this evening; denies urinary s/s; has not been around anyone else that is sick; currently with mild nausea

## 2016-07-02 ENCOUNTER — Emergency Department: Payer: BLUE CROSS/BLUE SHIELD

## 2016-07-02 ENCOUNTER — Emergency Department
Admission: EM | Admit: 2016-07-02 | Discharge: 2016-07-02 | Disposition: A | Discharge status: Home / Self Care | Payer: BLUE CROSS/BLUE SHIELD | Attending: Emergency Medicine | Admitting: Emergency Medicine

## 2016-07-02 ENCOUNTER — Encounter: Payer: Self-pay | Admitting: Radiology

## 2016-07-02 DIAGNOSIS — R109 Unspecified abdominal pain: Secondary | ICD-10-CM

## 2016-07-02 LAB — URINALYSIS, COMPLETE (UACMP) WITH MICROSCOPIC
BILIRUBIN URINE: NEGATIVE
Glucose, UA: NEGATIVE mg/dL
HGB URINE DIPSTICK: NEGATIVE
Ketones, ur: NEGATIVE mg/dL
LEUKOCYTES UA: NEGATIVE
NITRITE: NEGATIVE
PH: 5 (ref 5.0–8.0)
Protein, ur: NEGATIVE mg/dL
SPECIFIC GRAVITY, URINE: 1.028 (ref 1.005–1.030)

## 2016-07-02 LAB — PREGNANCY, URINE: Preg Test, Ur: NEGATIVE

## 2016-07-02 MED ORDER — KETOROLAC TROMETHAMINE 30 MG/ML IJ SOLN
30.0000 mg | Freq: Once | INTRAMUSCULAR | Status: AC
  Administered 2016-07-02: 30 mg via INTRAVENOUS
  Filled 2016-07-02: qty 1

## 2016-07-02 MED ORDER — ETODOLAC 200 MG PO CAPS: 200.0000 mg | ORAL_CAPSULE | Freq: Three times a day (TID) | ORAL | 0 refills | Status: AC

## 2016-07-02 MED ORDER — ACETAMINOPHEN 325 MG PO TABS
ORAL_TABLET | ORAL | Status: AC
  Filled 2016-07-02: qty 2

## 2016-07-02 MED ORDER — IOPAMIDOL (ISOVUE-300) INJECTION 61%
100.0000 mL | Freq: Once | INTRAVENOUS | Status: AC | PRN
  Administered 2016-07-02: 100 mL via INTRAVENOUS

## 2016-07-02 MED ORDER — IOPAMIDOL (ISOVUE-300) INJECTION 61%
30.0000 mL | Freq: Once | INTRAVENOUS | Status: AC
  Administered 2016-07-02: 30 mL via ORAL

## 2016-07-02 MED ORDER — GI COCKTAIL ~~LOC~~
30.0000 mL | Freq: Once | ORAL | Status: AC
  Administered 2016-07-02: 30 mL via ORAL
  Filled 2016-07-02: qty 30

## 2016-07-02 MED ORDER — SODIUM CHLORIDE 0.9 % IV BOLUS (SEPSIS)
1000.0000 mL | Freq: Once | INTRAVENOUS | Status: AC
  Administered 2016-07-02: 1000 mL via INTRAVENOUS

## 2016-07-02 MED ORDER — ACETAMINOPHEN 325 MG PO TABS
650.0000 mg | ORAL_TABLET | Freq: Once | ORAL | Status: AC
  Administered 2016-07-02: 650 mg via ORAL

## 2016-07-02 NOTE — ED Notes (Signed)
Delay explained to pt. No needs expressed.

## 2016-07-02 NOTE — ED Notes (Signed)
Pt says pain is better but still having abd cramping;

## 2016-07-02 NOTE — ED Notes (Signed)
Pt back from Ultrasound. Pt states "I feel a little better."

## 2016-07-02 NOTE — ED Notes (Signed)
Pt updated on delay. Pt helped to reposition.

## 2016-07-02 NOTE — ED Provider Notes (Signed)
[facility] Emergency Department Provider Note   ____________________________________________   First MD Initiated Contact with Patient 07/02/16 [phone]     (approximate)  I have reviewed the triage vital signs and the nursing notes.   HISTORY  Chief Complaint Abdominal Pain and Emesis    HPI Lori Beck is a 20 y.o. female who comes into the hospital today with some abdominal pain. She reports it started yesterday with some cramping and burning. The patient has a history of celiac disease so she thought it was due to a gluten allergy. She reports that she did vomit at 9 PM. The patient did not take anything for the pain at home because she reports that over-the-counter medicines usually work for her. The patient did have some brown looking emesis but no diarrhea. She reports though that the pain has persisted. It is currently a 9-10 out of 10 in intensity. She reports that the pain is all over her abdomen but mainly in her lower abdomen.The patient reports that she has not had her menstrual period in some time as she is on birth control pills. The patient reports that she hasn't eaten and drank much today. She reports that she has had stomach pain in the past but it has not been as bad as this nor has been as persistent as this. The patient is here today for evaluation.   Past Medical History:  Diagnosis Date  . Celiac disease   . Polycystic ovarian disease     There are no active problems to display for this patient.   History reviewed. No pertinent surgical history.  Prior to Admission medications   Medication Sig Start Date End Date Taking? Authorizing Provider  JUNEL 1.5/30 1.5-30 MG-MCG tablet Take 1 tablet by mouth at bedtime. 10/27/15  Yes Historical Provider, MD  metFORMIN (GLUCOPHAGE) 500 MG tablet Take 500 mg by mouth daily with breakfast.   Yes Historical Provider, MD  etodolac (LODINE) 200 MG capsule Take 1 capsule (200 mg total) by mouth  every 8 (eight) hours. 07/02/16   Rebecka ApleyAllison P Massie Mees, MD  fluconazole (DIFLUCAN) 150 MG tablet Take 1 tablet (150 mg total) by mouth daily. Patient not taking: Reported on 07/02/2016 06/08/15   Emily FilbertJonathan E Williams, MD  HYDROcodone-acetaminophen (NORCO/VICODIN) 5-325 MG per tablet Take 1 tablet by mouth every 4 (four) hours as needed. Patient not taking: Reported on 07/02/2016 08/18/14   Rolland PorterMark James, MD  naproxen (NAPROSYN) 500 MG tablet Take 1 tablet (500 mg total) by mouth 2 (two) times daily. Patient not taking: Reported on 07/02/2016 08/18/14   Rolland PorterMark James, MD  nitrofurantoin, macrocrystal-monohydrate, (MACROBID) 100 MG capsule Take 1 capsule (100 mg total) by mouth 2 (two) times daily. Patient not taking: Reported on 07/02/2016 11/06/15   Gayla DossEryka A Gayle, MD  ondansetron (ZOFRAN ODT) 4 MG disintegrating tablet Take 1 tablet (4 mg total) by mouth every 8 (eight) hours as needed for nausea or vomiting. Patient not taking: Reported on 07/02/2016 11/06/15   Gayla DossEryka A Gayle, MD  traMADol (ULTRAM) 50 MG tablet Take 1 tablet (50 mg total) by mouth every 8 (eight) hours as needed. Do not drive while taking this medication. Patient not taking: Reported on 07/02/2016 11/07/15 11/06/16  Gayla DossEryka A Gayle, MD    Allergies Patient has no known allergies.  History reviewed. No pertinent family history.  Social History Social History  Substance Use Topics  . Smoking status: Never Smoker  . Smokeless tobacco: Never Used  . Alcohol use Yes  Review of Systems Constitutional: No fever/chills Eyes: No visual changes. ENT: No sore throat. Cardiovascular: Denies chest pain. Respiratory: Denies shortness of breath. Gastrointestinal:  abdominal pain, nausea, vomiting.  No diarrhea.  No constipation. Genitourinary: Negative for dysuria. Musculoskeletal: Negative for back pain. Skin: Negative for rash. Neurological: Negative for headaches, focal weakness or numbness.  10-point ROS otherwise  negative.  ____________________________________________   PHYSICAL EXAM:  VITAL SIGNS: ED Triage Vitals [07/01/16 2318]  Enc Vitals Group     BP 129/74     Pulse Rate (!) 108     Resp 18     Temp 99.7 F (37.6 C)     Temp Source Oral     SpO2 96 %     Weight 170 lb (77.1 kg)     Height 5\' 8"  (1.727 m)     Head Circumference      Peak Flow      Pain Score 8     Pain Loc      Pain Edu?      Excl. in GC?     Constitutional: Alert and oriented. Well appearing and in Moderate distress. Eyes: Conjunctivae are normal. PERRL. EOMI. Head: Atraumatic. Nose: No congestion/rhinnorhea. Mouth/Throat: Mucous membranes are moist.  Oropharynx non-erythematous. Cardiovascular: Normal rate, regular rhythm. Grossly normal heart sounds.  Good peripheral circulation. Respiratory: Normal respiratory effort.  No retractions. Lungs CTAB. Gastrointestinal: Soft with tenderness to palpation in the lower abdomen.. No distention. Positive bowel sounds Musculoskeletal: No lower extremity tenderness nor edema.   Neurologic:  Normal speech and language.  Skin:  Skin is warm, dry and intact.  Psychiatric: Mood and affect are normal.   ____________________________________________   LABS (all labs ordered are listed, but only abnormal results are displayed)  Labs Reviewed  COMPREHENSIVE METABOLIC PANEL - Abnormal; Notable for the following:       Result Value   Potassium 3.4 (*)    Glucose, Bld 122 (*)    All other components within normal limits  URINALYSIS, COMPLETE (UACMP) WITH MICROSCOPIC - Abnormal; Notable for the following:    Color, Urine YELLOW (*)    APPearance CLEAR (*)    Bacteria, UA RARE (*)    Squamous Epithelial / LPF 6-30 (*)    All other components within normal limits  LIPASE, BLOOD  CBC  PREGNANCY, URINE   ____________________________________________  EKG  none ____________________________________________  RADIOLOGY  Pelvic ultrasound CT abdomen and  pelvis ____________________________________________   PROCEDURES  Procedure(s) performed: None  Procedures  Critical Care performed: No  ____________________________________________   INITIAL IMPRESSION / ASSESSMENT AND PLAN / ED COURSE  Pertinent labs & imaging results that were available during my care of the patient were reviewed by me and considered in my medical decision making (see chart for details).  This is a 20 year old female who comes into the hospital today with some abdominal pain. She reports that it persistent more than she has had in the past. The patient's blood work is unremarkable. Did send her for an ultrasound as well as a CT scan to evaluate her pain. The patient did receive some GI cocktail with a concern for colicky abdominal pain. She also received a dose of Toradol. I gave the patient a liter of normal saline.  Clinical Course as of Jul 02 822  Wynelle Link Jul 02, 2016  1610 Free fluid in the lower cervical canal, similar to prior sonogram. US Pelvis Complete [AW]  0713 1. No abnormality in the abdomen/pelvis. 2. 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   CT Abdomen Pelvis W Contrast [AW]    Clinical Course User Index [AW] Rebecka ApleyAllison P Rondarius Kadrmas, MD   The patient's imaging studies are unremarkable. She has some free fluid in her lower cervical canal but is similar to a prior sonogram. The patient had some dilation of her bilateral renal collecting systems but again that is chronic. I am unsure what causing the patient's pain but I will discharge her have her follow back up with her primary care physician. The patient has no further questions at this time. Again she should follow-up with her primary care physician.  ____________________________________________   FINAL CLINICAL IMPRESSION(S) / ED DIAGNOSES  Final diagnoses:  Abdominal pain      NEW MEDICATIONS STARTED DURING  THIS VISIT:  New Prescriptions   ETODOLAC (LODINE) 200 MG CAPSULE    Take 1 capsule (200 mg total) by mouth every 8 (eight) hours.     Note:  This document was prepared using Dragon voice recognition software and may include unintentional dictation errors.    Rebecka ApleyAllison P Luciana Cammarata, MD 07/02/16 [phone]

## 2016-07-02 NOTE — ED Notes (Signed)
Patient transported to Ultrasound 

## 2016-09-28 ENCOUNTER — Other Ambulatory Visit: Payer: Self-pay | Admitting: Gastroenterology

## 2016-09-28 DIAGNOSIS — R1033 Periumbilical pain: Secondary | ICD-10-CM

## 2016-10-12 ENCOUNTER — Other Ambulatory Visit: Payer: Self-pay | Admitting: Gastroenterology

## 2016-10-13 ENCOUNTER — Encounter
Admission: RE | Admit: 2016-10-13 | Discharge: 2016-10-13 | Disposition: A | Discharge status: Home / Self Care | Payer: BLUE CROSS/BLUE SHIELD | Source: Ambulatory Visit | Attending: Gastroenterology | Admitting: Gastroenterology

## 2016-10-13 ENCOUNTER — Other Ambulatory Visit
Admission: RE | Admit: 2016-10-13 | Discharge: 2016-10-13 | Disposition: A | Discharge status: Home / Self Care | Payer: BLUE CROSS/BLUE SHIELD | Source: Ambulatory Visit | Attending: Gastroenterology | Admitting: Gastroenterology

## 2016-10-13 ENCOUNTER — Ambulatory Visit
Admission: RE | Admit: 2016-10-13 | Discharge: 2016-10-13 | Disposition: A | Discharge status: Home / Self Care | Payer: BLUE CROSS/BLUE SHIELD | Source: Ambulatory Visit | Attending: Gastroenterology | Admitting: Gastroenterology

## 2016-10-13 DIAGNOSIS — K900 Celiac disease: Secondary | ICD-10-CM | POA: Insufficient documentation

## 2016-10-13 DIAGNOSIS — R109 Unspecified abdominal pain: Secondary | ICD-10-CM | POA: Insufficient documentation

## 2016-10-13 DIAGNOSIS — R1033 Periumbilical pain: Secondary | ICD-10-CM | POA: Diagnosis not present

## 2016-10-13 LAB — PREGNANCY, URINE: Preg Test, Ur: NEGATIVE

## 2016-10-13 MED ORDER — TECHNETIUM TC 99M MEBROFENIN IV KIT
5.0000 | PACK | Freq: Once | INTRAVENOUS | Status: AC | PRN
  Administered 2016-10-13: 5.16 via INTRAVENOUS

## 2016-11-22 IMAGING — US US PELVIS COMPLETE
1 series · 13 of 25 positions shown · non-contrast
Comparison: Multiple exams, including 08/18/2014

CLINICAL DATA: Pelvic pain for 12 hr.

EXAM:
TRANSABDOMINAL AND TRANSVAGINAL ULTRASOUND OF PELVIS
TECHNIQUE: Both transabdominal and transvaginal ultrasound examinations of the
pelvis were performed. Transabdominal technique was performed for
global imaging of the pelvis including uterus, ovaries, adnexal
regions, and pelvic cul-de-sac. It was necessary to proceed with
endovaginal exam following the transabdominal exam to visualize the
uterus and ovaries..

[Series 1: us pelvis complete · 0.18mm/px · 13 of 81 slices shown]
[im 1/81]
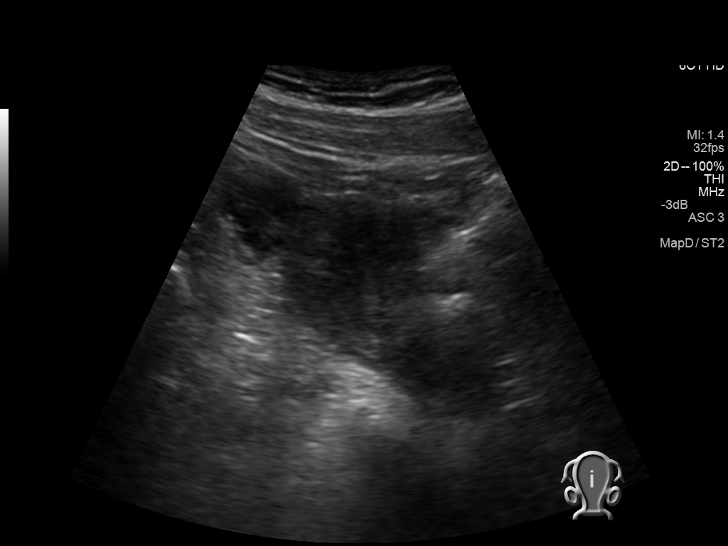
[im 7/81]
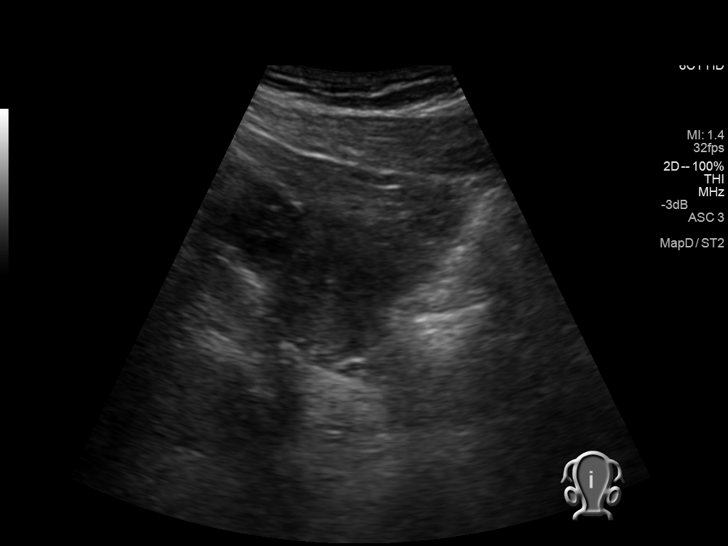
[im 14/81]
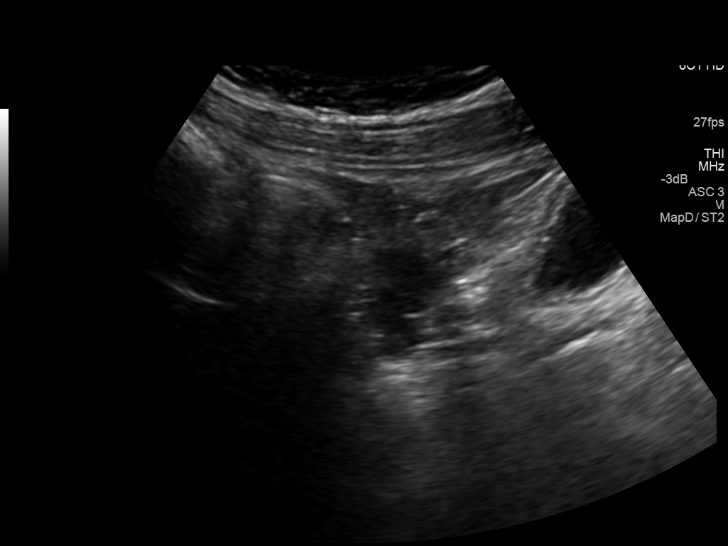
[im 21/81]
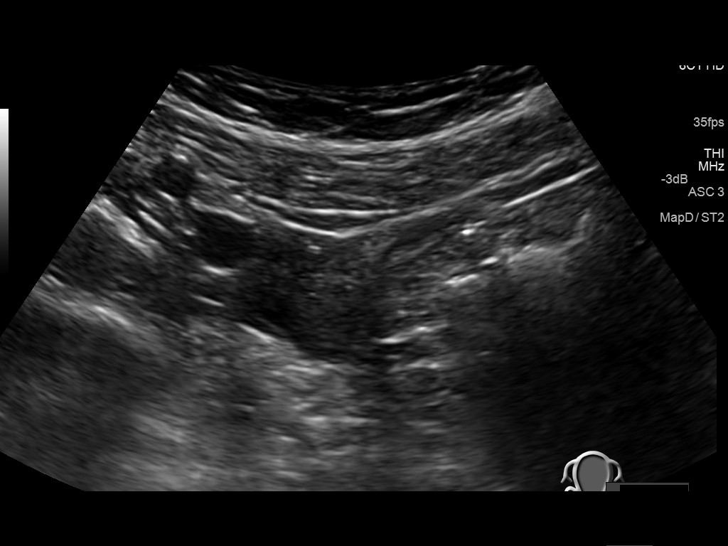
[im 27/81]
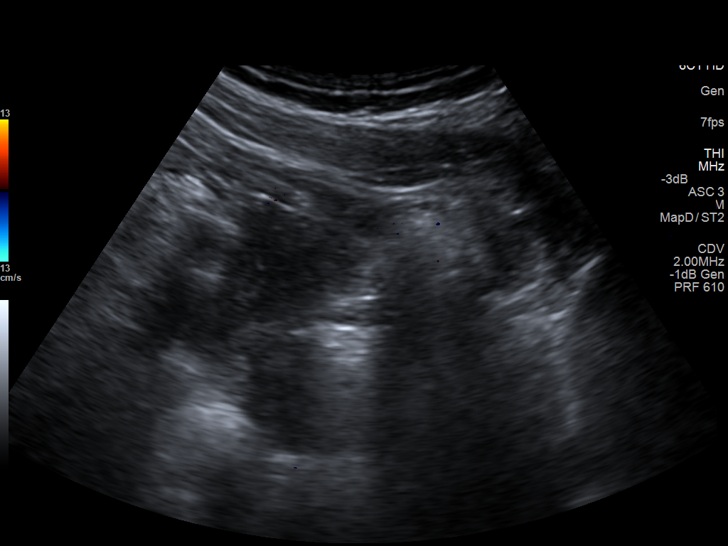
[im 34/81]
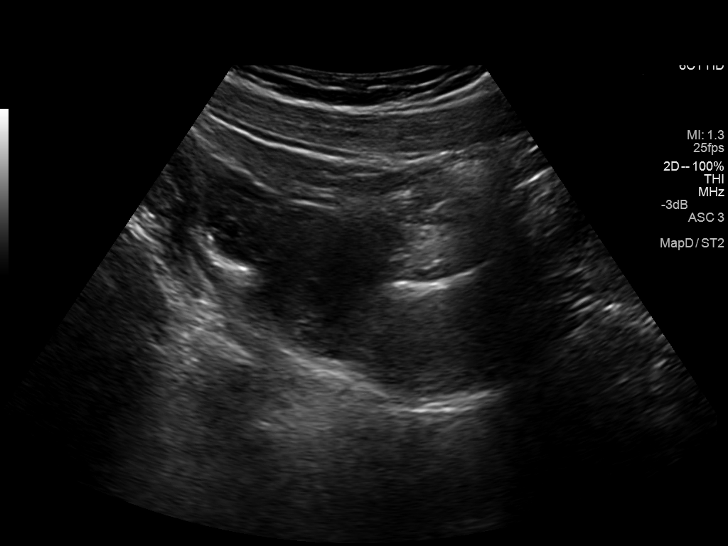
[im 41/81]
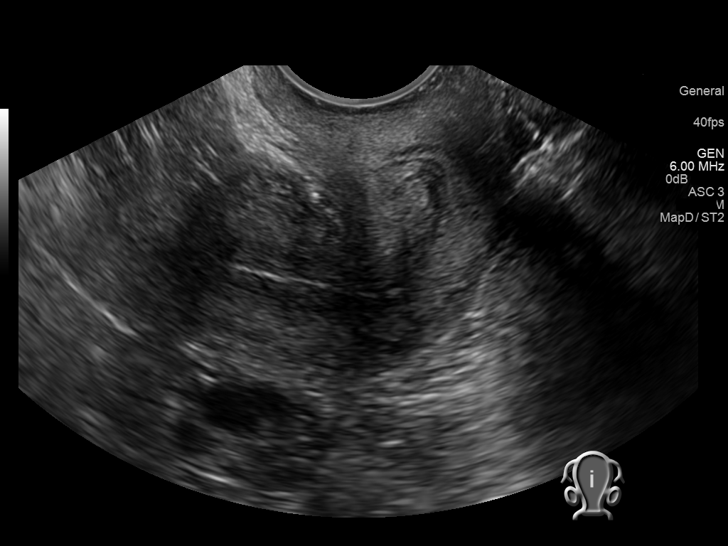
[im 47/81]
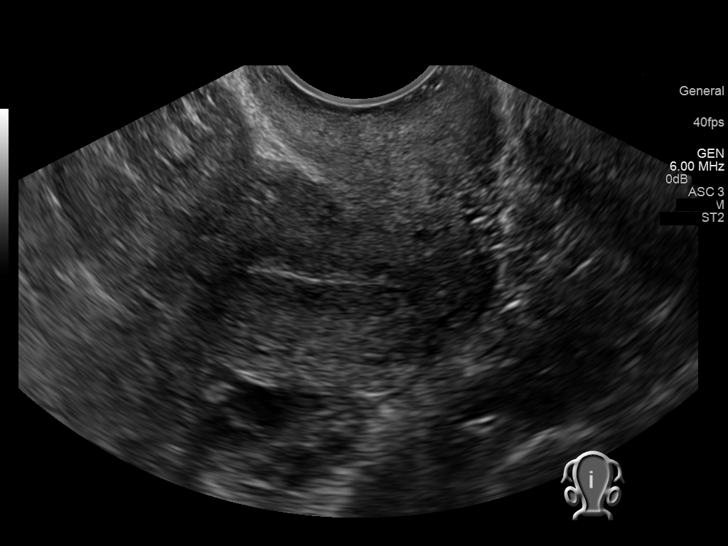
[im 54/81]
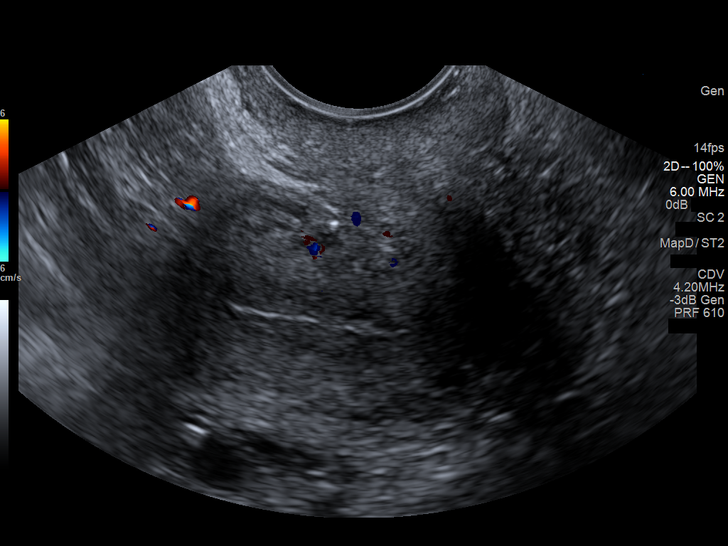
[im 61/81]
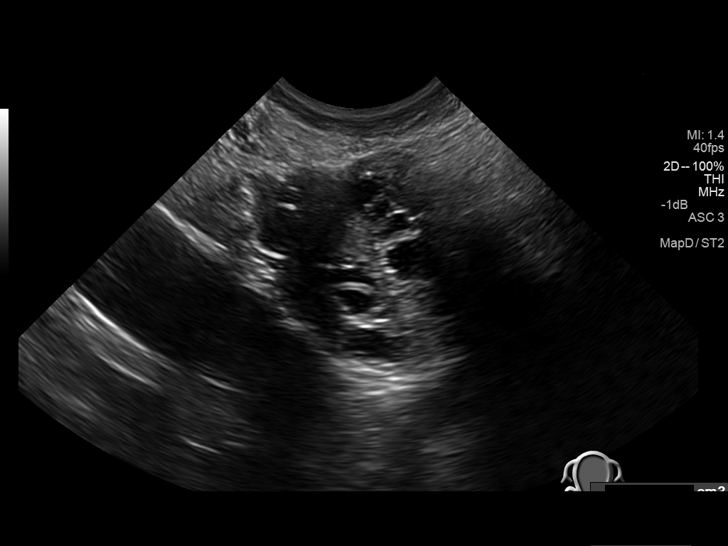
[im 67/81]
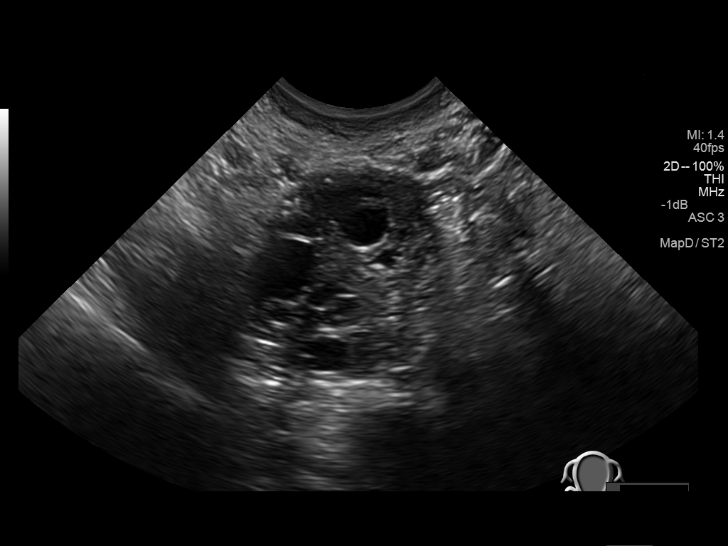
[im 74/81]
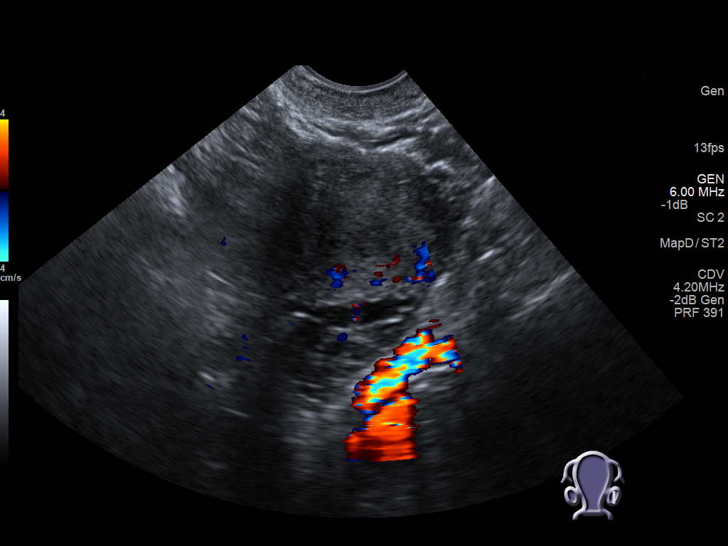
[im 81/81]
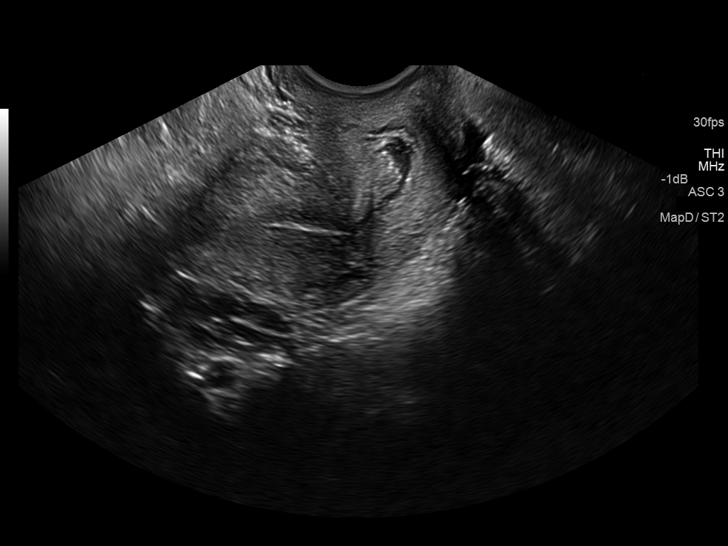

[13 of 25 positions shown; findings below may reference images not displayed]

FINDINGS: Uterus

Measurements: 7.3 by 3.9 by 3.6 cm. No fibroids or other mass
visualized.

Endometrium

Thickness: 2 mm along the uterine body. Along the lower uterine
segment and cervix, there is irregular echogenic material cough
without demonstrable internal Doppler flow, measuring 2.2 by 1.3 by
0.8 cm as shown on image 41 of today's exam, and surrounded by fluid
echogenicity.

Right ovary

Measurements: 3.1 by 2.1 by 2.3 cm. Normal appearance/no adnexal
mass.

Left ovary

Measurements: 3.3 by 1.9 by 3.5 cm. Normal appearance/no adnexal
mass.

Other findings

No free fluid.
IMPRESSION: 1. Abnormal echogenic material in the cervical canal and endometrium
of the lower uterine segment, without internal Doppler flow,
probably representing blood clot. Urine pregnancy test today was
negative, accordingly I doubt that this represents a spontaneous
abortion in progress. Polyp is also a less likely differential
diagnostic consideration given the elongated and somewhat irregular
appearance and lack of internal flow. If the patient experiences
abnormal uterine bleeding or other continued gynecologic symptoms
despite conservative therapy, then reimaging of the pelvis in 6
weeks time may be warranted for reassessment.

## 2017-06-21 ENCOUNTER — Encounter (HOSPITAL_COMMUNITY): Payer: Self-pay | Admitting: Emergency Medicine

## 2017-06-21 ENCOUNTER — Emergency Department (HOSPITAL_COMMUNITY)
Admission: EM | Admit: 2017-06-21 | Discharge: 2017-06-21 | Disposition: A | Discharge status: Home / Self Care | Payer: BLUE CROSS/BLUE SHIELD | Attending: Emergency Medicine | Admitting: Emergency Medicine

## 2017-06-21 DIAGNOSIS — R202 Paresthesia of skin: Secondary | ICD-10-CM

## 2017-06-21 DIAGNOSIS — Z79899 Other long term (current) drug therapy: Secondary | ICD-10-CM | POA: Insufficient documentation

## 2017-06-21 NOTE — ED Provider Notes (Signed)
[facility]-EMERGENCY DEPT Provider Note   CSN: [id number] Arrival date & time: 06/21/17  1645     History   Chief Complaint Chief Complaint  Patient presents with  . Numbness  . Medication Reaction    HPI Lori Beck is a 21 y.o. female.  21 year old female presents with 2-day history of bilateral lower extremity paresthesias as well as cold intolerance.  Symptoms started after she started taking prednisone for URI.  States that those symptoms have improved.  Denies any bowel or bladder dysfunction.  Denies any neck pain.  Denies any trouble with her gait.  Symptoms seem to wax and wane.  No treatment used prior to arrival      Past Medical History:  Diagnosis Date  . Celiac disease   . Polycystic ovarian disease     There are no active problems to display for this patient.   History reviewed. No pertinent surgical history.  OB History    No data available       Home Medications    Prior to Admission medications   Medication Sig Start Date End Date Taking? Authorizing Provider  etodolac (LODINE) 200 MG capsule Take 1 capsule (200 mg total) by mouth every 8 (eight) hours. 07/02/16   Rebecka ApleyWebster, Allison P, MD  fluconazole (DIFLUCAN) 150 MG tablet Take 1 tablet (150 mg total) by mouth daily. Patient not taking: Reported on 07/02/2016 06/08/15   Emily FilbertWilliams, Jonathan E, MD  HYDROcodone-acetaminophen (NORCO/VICODIN) 5-325 MG per tablet Take 1 tablet by mouth every 4 (four) hours as needed. Patient not taking: Reported on 07/02/2016 08/18/14   Rolland PorterJames, Mark, MD  JUNEL 1.5/30 1.5-30 MG-MCG tablet Take 1 tablet by mouth at bedtime. 10/27/15   [provider]  metFORMIN (GLUCOPHAGE) 500 MG tablet Take 500 mg by mouth daily with breakfast.    [provider]  naproxen (NAPROSYN) 500 MG tablet Take 1 tablet (500 mg total) by mouth 2 (two) times daily. Patient not taking: Reported on 07/02/2016 08/18/14   Rolland PorterJames, Mark, MD  nitrofurantoin,  macrocrystal-monohydrate, (MACROBID) 100 MG capsule Take 1 capsule (100 mg total) by mouth 2 (two) times daily. Patient not taking: Reported on 07/02/2016 11/06/15   Gayla DossGayle, Eryka A, MD  ondansetron (ZOFRAN ODT) 4 MG disintegrating tablet Take 1 tablet (4 mg total) by mouth every 8 (eight) hours as needed for nausea or vomiting. Patient not taking: Reported on 07/02/2016 11/06/15   Gayla DossGayle, Eryka A, MD    Family History No family history on file.  Social History Social History   Tobacco Use  . Smoking status: Never Smoker  . Smokeless tobacco: Never Used  Substance Use Topics  . Alcohol use: Yes  . Drug use: No     Allergies   Patient has no known allergies.   Review of Systems Review of Systems  All other systems reviewed and are negative.    Physical Exam Updated Vital Signs BP (!) 144/100 (BP Location: Left Arm)   Pulse 93   Temp 98.8 F (37.1 C) (Oral)   Resp 18   LMP 06/21/2017   SpO2 100%   Physical Exam  Constitutional: She is oriented to person, place, and time. She appears well-developed and well-nourished.  Non-toxic appearance. No distress.  HENT:  Head: Normocephalic and atraumatic.  Eyes: Conjunctivae, EOM and lids are normal. Pupils are equal, round, and reactive to light.  Neck: Normal range of motion. Neck supple. No tracheal deviation present. No thyroid mass present.  Cardiovascular: Normal rate, regular  rhythm and normal heart sounds. Exam reveals no gallop.  No murmur heard. Pulmonary/Chest: Effort normal and breath sounds normal. No stridor. No respiratory distress. She has no decreased breath sounds. She has no wheezes. She has no rhonchi. She has no rales.  Abdominal: Soft. Normal appearance and bowel sounds are normal. She exhibits no distension. There is no tenderness. There is no rebound and no CVA tenderness.  Musculoskeletal: Normal range of motion. She exhibits no edema or tenderness.  Neurological: She is alert and oriented to person, place,  and time. She has normal strength. No cranial nerve deficit or sensory deficit. She displays a negative Romberg sign. Coordination and gait normal. GCS eye subscore is 4. GCS verbal subscore is 5. GCS motor subscore is 6.  Skin: Skin is warm and dry. No abrasion and no rash noted.  Psychiatric: She has a normal mood and affect. Her speech is normal and behavior is normal.  Nursing note and vitals reviewed.    ED Treatments / Results  Labs (all labs ordered are listed, but only abnormal results are displayed) Labs Reviewed - No data to display  EKG  EKG Interpretation None       Radiology No results found.  Procedures Procedures (including critical care time)  Medications Ordered in ED Medications - No data to display   Initial Impression / Assessment and Plan / ED Course  I have reviewed the triage vital signs and the nursing notes.  Pertinent labs & imaging results that were available during my care of the patient were reviewed by me and considered in my medical decision making (see chart for details).     Patient is lower extremity exam normal.  Her gait is normal.  She has no evidence of allergic reaction.  She is mildly hypertensive and I suspect that this is from the 60 mg of prednisone per day that she is taking.  Is currently unclear as the cause of her symptoms.  Does not appear to have any focal neurological weakness.  Her sensation is normal.  Patient instructed to stop taking prednisone at this time and return precautions given Final Clinical Impressions(s) / ED Diagnoses   Final diagnoses:  None    ED Discharge Orders    None       Lorre NickAllen, Palmer Shorey, MD 06/21/17 1925

## 2017-06-21 NOTE — ED Notes (Signed)
Went in to give pt her discharge instructions but pt was not in room  Dr Freida BusmanAllen states he discussed her discharge plan with her

## 2017-06-21 NOTE — Discharge Instructions (Signed)
Stop taking the prednisone.  Return here once for any trouble walking, urinating, moving her bowels, or any other reason.

## 2017-06-21 NOTE — ED Triage Notes (Signed)
Patient states she was seen at [facility] yesterday and diagnosed with URI. Pt reports taking prednisone yesterday and today. Starting 8am this morning pt began having bilateral leg numbness sensation. Pt ambulatory.

## 2017-07-27 ENCOUNTER — Other Ambulatory Visit: Payer: Self-pay | Admitting: Gastroenterology

## 2017-07-27 DIAGNOSIS — R9389 Abnormal findings on diagnostic imaging of other specified body structures: Secondary | ICD-10-CM

## 2017-08-06 ENCOUNTER — Other Ambulatory Visit: Payer: BLUE CROSS/BLUE SHIELD

## 2017-08-15 ENCOUNTER — Ambulatory Visit
Admission: RE | Admit: 2017-08-15 | Discharge: 2017-08-15 | Disposition: A | Discharge status: Home / Self Care | Payer: BLUE CROSS/BLUE SHIELD | Source: Ambulatory Visit | Attending: Gastroenterology | Admitting: Gastroenterology

## 2017-08-15 DIAGNOSIS — R9389 Abnormal findings on diagnostic imaging of other specified body structures: Secondary | ICD-10-CM

## 2017-08-15 MED ORDER — IOPAMIDOL (ISOVUE-300) INJECTION 61%
100.0000 mL | Freq: Once | INTRAVENOUS | Status: AC | PRN
  Administered 2017-08-15: 100 mL via INTRAVENOUS

## 2017-08-22 ENCOUNTER — Other Ambulatory Visit: Payer: Self-pay

## 2017-08-22 ENCOUNTER — Encounter: Payer: Self-pay | Admitting: Physical Therapy

## 2017-08-22 ENCOUNTER — Ambulatory Visit: Payer: BLUE CROSS/BLUE SHIELD | Attending: Obstetrics and Gynecology | Admitting: Physical Therapy

## 2017-08-22 DIAGNOSIS — R252 Cramp and spasm: Secondary | ICD-10-CM | POA: Insufficient documentation

## 2017-08-22 DIAGNOSIS — M6281 Muscle weakness (generalized): Secondary | ICD-10-CM

## 2017-08-22 NOTE — Patient Instructions (Signed)
Piriformis Stretch, Supine    Lie supine, one ankle crossed onto opposite knee. Holding bottom leg behind knee, gently pull legs toward chest until stretch is felt in buttock of top leg. Hold _30__ seconds. For deeper stretch gently push top knee away from body.  Repeat _3__ times per session. Do _2__ sessions per day.  Copyright  VHI. All rights reserved.    Chair Sitting    Sit at edge of seat, spine straight, one leg extended. Put a hand on each thigh and bend forward from the hip, keeping spine straight, hinge at the hips. Allow hand on extended leg to reach toward toes. Support upper body with other arm. Hold __30_ seconds. Repeat __3_ times per session. Do __2_ sessions per day.  Copyright  VHI. All rights reserved.     Happy Baby  1. Position yourself as shown, grabbing onto the feet or behind the knees; you should feel a gentle stretch.  2. Breathe in and allow the pelvic floor muscles to relax.  3. Hold this position for 2-3 minutes.  Butterfly, Supine    Lie on back, feet together. Lower knees toward floor. Hold __60_ seconds. Repeat __1_ times per session. Do _2__ sessions per day.  Copyright  VHI. All rights reserved.    [facility] [address], [address] [place], [place] [address] Phone # [phone] Fax [phone]

## 2017-08-22 NOTE — Therapy (Signed)
[facility] [address], [address] [place], [place], [address] Phone: [phone]   Fax:  [phone]  Physical Therapy Evaluation  Patient Details  Name: Lori Beck MRN: [id number] Date of Birth: [birth year] Referring Provider: Arlana Lindau, NP   Encounter Date: 08/22/2017  PT End of Session - 08/22/17 1440    Visit Number  1    Date for PT Re-Evaluation  11/14/17    PT Start Time  1440    PT Stop Time  1524    PT Time Calculation (min)  44 min    Activity Tolerance  Patient tolerated treatment well    Behavior During Therapy  [facility] for tasks assessed/performed       Past Medical History:  Diagnosis Date  . Celiac disease   . Polycystic ovarian disease     History reviewed. No pertinent surgical history.  There were no vitals filed for this visit.   Subjective Assessment - 08/22/17 1442    Subjective  Has had chronic abdominal and stomach pain for 4 years. Has had a flare up in last 6 months.  Pain with intercourse and with pelvic exam if it hits a certain place if feels like hitting the cervix.  Sometimes cramps after intercourse and can last for 5-10 minutes.     Limitations  Other (comment) pelvic exams; intercourse    Patient Stated Goals  get rid of pain, be able to have intercourse and return to exercise    Currently in Pain?  No/denies no pain currently; as described below when occurs    Pain Location  Abdomen    Pain Orientation  Mid    Pain Descriptors / Indicators  Cramping;Jabbing    Pain Type  Chronic pain    Pain Radiating Towards  abdomen    Pain Onset  More than a month ago    Pain Frequency  Intermittent    Aggravating Factors   intercourse    Pain Relieving Factors  doing breathing exercises    Multiple Pain Sites  No         [facility] PT Assessment - 08/22/17 0001      Assessment   Medical Diagnosis  N94.10 (ICD-10-CM) - Unspecified dyspareunia    Referring Provider  Arlana Lindau, NP    Prior Therapy  No      Precautions   Precautions  None      Restrictions   Weight Bearing Restrictions  No      Balance Screen   Has the patient fallen in the past 6 months  No      Prior Function   Level of Independence  Independent    Vocation  Part time employment    Vocation Requirements  sitting    Leisure  walking dog, plays with dog, gym      Cognition   Overall Cognitive Status  Within Functional Limits for tasks assessed      Observation/Other Assessments   Focus on Therapeutic Outcomes (FOTO)   20% limited based on clinical assessment      Posture/Postural Control   Posture/Postural Control  Postural limitations    Postural Limitations  Rounded Shoulders      ROM / Strength   AROM / PROM / Strength  Strength;PROM      PROM   Overall PROM Comments  hip IR 25% limited left; 40% limited right      Strength   Overall Strength Comments  right adduct 3+; left add 4-  Flexibility   Soft Tissue Assessment /Muscle Length  yes    Hamstrings  limited and tight bilaterally (Rt>Lt)      Palpation   Palpation comment  rectus abdominis tight and tender throughout, bilateral hip flexors tight and tender to palpation, bilateral hamstring tight and tender insertion      Special Tests   Other special tests  SLR - more difficult raising Rt             Objective measurements completed on examination: See above findings.    Pelvic Floor Special Questions - 08/22/17 0001    Prior Pelvic/Prostate Exam  Yes    Prior Urinalysis  Yes    Result of last Urinalysis  chronic UTI and yeast infection in early college    Are you Pregnant or attempting pregnancy?  No    Prior Pregnancies  No    Currently Sexually Active  No    Marinoff Scale  pain prevents any attempts at intercourse    Urinary Leakage  No    Urinary urgency  No    Fecal incontinence  No occasionally constipated    Fluid intake  few glasses of water    Caffeine beverages  no    Prolapse  None     Pelvic Floor Internal Exam  pt informed and consent given to perform internal assessment    Exam Type  Vaginal    Palpation  right adductor attachments tender, ischiocavernosis, bilateral obdurator internus, transverse peroneus tight and tender with increased muscle spasms during pelvic assessment due to sensativity    Strength  fair squeeze, definite lift    Strength # of reps  4    Strength # of seconds  2    Tone  high       [facility] Adult PT Treatment/Exercise - 08/22/17 0001      Exercises   Exercises  Other Exercises    Other Exercises   HEP as seen in chart - educated and performed             PT Education - 08/22/17 1527    Education provided  Yes    Education Details  h/s stretch, piriformis, butterfly, happy baby    Person(s) Educated  Patient    Methods  Explanation;Demonstration;Verbal cues;Handout;Tactile cues    Comprehension  Verbalized understanding;Returned demonstration       PT Short Term Goals - 08/22/17 1558      PT SHORT TERM GOAL #1   Title  ind with initial HEP    Time  4    Period  Weeks    Status  New    Target Date  09/19/17      PT SHORT TERM GOAL #2   Title  be able to tolerate exam of levator ani and coccygeus muscles due to improved ability to relax pelvic floor    Time  4    Period  Weeks    Status  New    Target Date  09/19/17        PT Long Term Goals - 08/22/17 1559      PT LONG TERM GOAL #1   Title  pt will report 1/3 on Marinoff scale    Time  16    Period  Weeks    Status  New    Target Date  12/12/17      PT LONG TERM GOAL #2   Title  pt will be able to identify comfortable positions for intercourse in  order to manage pain and avoid flare ups    Time  16    Period  Weeks    Status  New    Target Date  12/12/17      PT LONG TERM GOAL #3   Title  pt will be able to return to normal exercise routine due to decreased pelvic and abdominal pain    Time  16    Period  Weeks    Status  New    Target Date  12/12/17       PT LONG TERM GOAL #4   Title  pt will be ind with advanced HEP and self massage techniques in order to manage pain symptoms    Time  16    Period  Weeks    Status  New    Target Date  12/12/17      PT LONG TERM GOAL #5   Title  Pt will demonstrate 5/5 MMT bilateral hip adduction for improved pelvic stability during functional movement    Time  16    Period  Weeks    Status  New    Target Date  12/12/17             Plan - 08/22/17 1545    Clinical Impression Statement  Pt presents to clinic due to chronic abdominal and pelvic pain during intercourse.  Pt has had a flare up in the past 6 months, but initially started having dyspareunia 4 years ago.  Patient has increased rounded shoulders in standing posture.  Pt presents with tight muscles and decreased hip ROM in bilateral LE.  She has tightness and hypersensativity throughout pelvic floor.  Pt has high tone pelvic floor with weakness 3/5 MMT and decreased endurance of 2 seconds for 4 reps.  Pt has muscle spasms of hip flexors and rectus abdominal with fascial restrictions throughout abdomen.  pt will benefit from skilled PT to address impairments and return to functional self care activities such as be able to get pelvic exam and return to sexual intercourse with her partner.      History and Personal Factors relevant to plan of care:  chronic pain    Clinical Presentation  Evolving    Clinical Presentation due to:  patient has gotten worse in past 6 months    Clinical Decision Making  Low    Rehab Potential  Good    PT Frequency  1x / week    PT Duration  Other (comment) 16 weeks    PT Treatment/Interventions  ADLs/Self Care Home Management;Biofeedback;Cryotherapy;Electrical Stimulation;Moist Heat;Ultrasound;Functional mobility training;Therapeutic activities;Therapeutic exercise;Neuromuscular re-education;Patient/family education;Manual techniques;Passive range of motion;Dry needling;Taping    PT Next Visit Plan  abdominal and  lumbar fascial release, DN info, stretches, breathing    Consulted and Agree with Plan of Care  Patient       Patient will benefit from skilled therapeutic intervention in order to improve the following deficits and impairments:  Pain, Increased fascial restricitons, Increased muscle spasms, Impaired tone, Decreased strength, Decreased range of motion, Impaired flexibility  Visit Diagnosis: Cramp and spasm - Plan: PT plan of care cert/re-cert  Muscle weakness (generalized) - Plan: PT plan of care cert/re-cert     Problem List There are no active problems to display for this patient.   Vincente PoliJakki Crosser, PT 08/22/2017, 4:07 PM  [facility] [address], [address] [place], [place], [address] Phone: [phone]   Fax:  [phone]  Name: Marta AntuMiranda Jolin MRN: [id number]  Date of Birth: 16-Apr-1996

## 2017-08-29 ENCOUNTER — Ambulatory Visit: Payer: BLUE CROSS/BLUE SHIELD | Admitting: Physical Therapy

## 2017-08-29 DIAGNOSIS — R252 Cramp and spasm: Secondary | ICD-10-CM | POA: Diagnosis not present

## 2017-08-29 DIAGNOSIS — M6281 Muscle weakness (generalized): Secondary | ICD-10-CM

## 2017-08-29 NOTE — Therapy (Signed)
[facility] [address], [address] [place], [place], [address] Phone: [phone]   Fax:  [phone]  Physical Therapy Treatment  Patient Details  Name: Lori Beck MRN: [id number] Date of Birth: 09/21/1995 Referring Provider: Arlana LindauFisher, Julie, NP   Encounter Date: 08/29/2017  PT End of Session - 08/29/17 1111    Visit Number  2    Date for PT Re-Evaluation  11/14/17    PT Start Time  1019    PT Stop Time  1059    PT Time Calculation (min)  40 min    Activity Tolerance  Patient tolerated treatment well    Behavior During Therapy  [facility] for tasks assessed/performed       Past Medical History:  Diagnosis Date  . Celiac disease   . Polycystic ovarian disease     No past surgical history on file.  There were no vitals filed for this visit.  Subjective Assessment - 08/29/17 1023    Subjective  The breathing and stretches has helped a little.  I am on 2 antibiotics    Patient Stated Goals  get rid of pain, be able to have intercourse and return to exercise    Currently in Pain?  Yes    Pain Score  6     Pain Location  Abdomen    Pain Orientation  Mid    Pain Descriptors / Indicators  Cramping    Pain Type  Chronic pain    Pain Radiating Towards  abdomen    Pain Onset  More than a month ago    Pain Frequency  Intermittent    Aggravating Factors   intercourse    Pain Relieving Factors  breathing and stretches    Multiple Pain Sites  No                      [facility] Adult PT Treatment/Exercise - 08/29/17 0001      Self-Care   Self-Care  Other Self-Care Comments    Other Self-Care Comments   educated on dry needling and self massage      Neuro Re-ed    Neuro Re-ed Details   breathing and bulging with tactile feedback      Manual Therapy   Manual Therapy  Soft tissue mobilization;Myofascial release    Manual therapy comments  pt informed and consent given to perform internal soft tissue release and  tactile cues for bulgeing    Soft tissue mobilization  transverse peroneus, hip flexor, rectus abdominal    Myofascial Release  abdominal fascial release right > left             PT Education - 08/29/17 1108    Education provided  Yes    Education Details  tirgger point dry needle and self massage    Person(s) Educated  Patient    Methods  Explanation;Demonstration;Verbal cues;Handout;Tactile cues    Comprehension  Verbalized understanding;Returned demonstration       PT Short Term Goals - 08/29/17 1120      PT SHORT TERM GOAL #1   Title  ind with initial HEP    Time  4    Period  Weeks    Status  Achieved      PT SHORT TERM GOAL #2   Title  be able to tolerate exam of levator ani and coccygeus muscles due to improved ability to relax pelvic floor    Time  4    Period  Weeks    Status  On-going        PT Long Term Goals - 08/22/17 1559      PT LONG TERM GOAL #1   Title  pt will report 1/3 on Marinoff scale    Time  16    Period  Weeks    Status  New    Target Date  12/12/17      PT LONG TERM GOAL #2   Title  pt will be able to identify comfortable positions for intercourse in order to manage pain and avoid flare ups    Time  16    Period  Weeks    Status  New    Target Date  12/12/17      PT LONG TERM GOAL #3   Title  pt will be able to return to normal exercise routine due to decreased pelvic and abdominal pain    Time  16    Period  Weeks    Status  New    Target Date  12/12/17      PT LONG TERM GOAL #4   Title  pt will be ind with advanced HEP and self massage techniques in order to manage pain symptoms    Time  16    Period  Weeks    Status  New    Target Date  12/12/17      PT LONG TERM GOAL #5   Title  Pt will demonstrate 5/5 MMT bilateral hip adduction for improved pelvic stability during functional movement    Time  16    Period  Weeks    Status  New    Target Date  12/12/17            Plan - 08/29/17 1112    Clinical  Impression Statement  Patient has trigger points and muscle spasms on right LE adductors, right side of abdomen, right hip flexor, and trasverse peroneus. Pt was able to demonstrate soft tissue relaxation with tactile cues and verbal cues to relax.  Pt will benefit from skilled PT to continue working on improved muscle coordination and control for self care and functional activities.    PT Treatment/Interventions  ADLs/Self Care Home Management;Biofeedback;Cryotherapy;Electrical Stimulation;Moist Heat;Ultrasound;Functional mobility training;Therapeutic activities;Therapeutic exercise;Neuromuscular re-education;Patient/family education;Manual techniques;Passive range of motion;Dry needling;Taping    PT Next Visit Plan  f/u on response to abdominal fascial release, abdominal and lumbar fascial release, DN adductors, lumbar, stretches, breathing, thoraci and pelvic diaphragm release    Consulted and Agree with Plan of Care  Patient       Patient will benefit from skilled therapeutic intervention in order to improve the following deficits and impairments:  Pain, Increased fascial restricitons, Increased muscle spasms, Impaired tone, Decreased strength, Decreased range of motion, Impaired flexibility  Visit Diagnosis: No diagnosis found.     Problem List There are no active problems to display for this patient.   Vincente Poli, PT 08/29/2017, 11:41 AM  [facility] [address] [place], [place], [address] Phone: [phone]   Fax:  [phone]  Name: Lori Beck MRN: [id number] Date of Birth: 1996-01-23

## 2017-08-29 NOTE — Patient Instructions (Signed)
Trigger Point Dry Needling  . What is Trigger Point Dry Needling (DN)? o DN is a physical therapy technique used to treat muscle pain and dysfunction. Specifically, DN helps deactivate muscle trigger points (muscle knots).  o A thin filiform needle is used to penetrate the skin and stimulate the underlying trigger point. The goal is for a local twitch response (LTR) to occur and for the trigger point to relax. No medication of any kind is injected during the procedure.   . What Does Trigger Point Dry Needling Feel Like?  o The procedure feels different for each individual patient. Some patients report that they do not actually feel the needle enter the skin and overall the process is not painful. Very mild bleeding may occur. However, many patients feel a deep cramping in the muscle in which the needle was inserted. This is the local twitch response.   Marland Kitchen. How Will I feel after the treatment? o Soreness is normal, and the onset of soreness may not occur for a few hours. Typically this soreness does not last longer than two days.  o Bruising is uncommon, however; ice can be used to decrease any possible bruising.  o In rare cases feeling tired or nauseous after the treatment is normal. In addition, your symptoms may get worse before they get better, this period will typically not last longer than 24 hours.   . What Can I do After My Treatment? o Increase your hydration by drinking more water for the next 24 hours. o You may place ice or heat on the areas treated that have become sore, however, do not use heat on inflamed or bruised areas. Heat often brings more relief post needling. o You can continue your regular activities, but vigorous activity is not recommended initially after the treatment for 24 hours. o DN is best combined with other physical therapy such as strengthening, stretching, and other therapies.    [facility] [address], [address] [place], [place]  [address] Phone # [phone] Fax [phone]   STRETCHING THE PELVIC FLOOR MUSCLES NO DILATOR  Supplies . Vaginal lubricant . Mirror (optional) . Gloves (optional) Positioning . Start in a semi-reclined position with your head propped up. Bend your knees and place your thumb or finger at the vaginal opening. Procedure . Apply a moderate amount of lubricant on the outer skin of your vagina, the labia minora.  Apply additional lubricant to your finger. Marland Kitchen. Spread the skin away from the vaginal opening. Place the end of your finger at the opening. . Do a maximum contraction of the pelvic floor muscles. Tighten the vagina and the anus maximally and relax. . When you know they are relaxed, gently and slowly insert your finger into your vagina, directing your finger slightly downward, for 2-3 inches of insertion. . Relax and stretch the 6 o'clock position . Hold each stretch for _2 min__ and repeat __1_ time with rest breaks of _1__ seconds between each stretch. . Repeat the stretching in the 4 o'clock and 8 o'clock positions. . Total time should be _6__ minutes, _1__ x per day.  Note the amount of theme your were able to achieve and your tolerance to your finger in your vagina. . Once you have accomplished the techniques you may try them in standing with one foot resting on the tub, or in other positions.  This is a good stretch to do in the shower if you don't need to use lubricant.    [facility]  [address] [place], [place] [address] Phone # [phone] Fax [phone]

## 2017-09-05 ENCOUNTER — Ambulatory Visit: Payer: BLUE CROSS/BLUE SHIELD | Attending: Obstetrics and Gynecology | Admitting: Physical Therapy

## 2017-09-05 DIAGNOSIS — R252 Cramp and spasm: Secondary | ICD-10-CM | POA: Insufficient documentation

## 2017-09-05 DIAGNOSIS — M6281 Muscle weakness (generalized): Secondary | ICD-10-CM | POA: Insufficient documentation

## 2017-09-12 ENCOUNTER — Ambulatory Visit: Payer: BLUE CROSS/BLUE SHIELD | Admitting: Physical Therapy

## 2017-09-12 DIAGNOSIS — R252 Cramp and spasm: Secondary | ICD-10-CM | POA: Diagnosis not present

## 2017-09-12 DIAGNOSIS — M6281 Muscle weakness (generalized): Secondary | ICD-10-CM

## 2017-09-12 NOTE — Therapy (Addendum)
[facility] [address] [place], [place], [address] Phone: [phone]   Fax:  [phone]  Physical Therapy Treatment  Patient Details  Name: Vivyan Biggers MRN: [id number] Date of Birth: [birth year] Referring Provider: Gustavo Lah, NP   Encounter Date: 09/12/2017  PT End of Session - 09/12/17 1736    Visit Number  3    Date for PT Re-Evaluation  11/14/17    PT Start Time  1022 pt arrived late    PT Stop Time  1058    PT Time Calculation (min)  36 min    Activity Tolerance  Patient tolerated treatment well    Behavior During Therapy  [facility] for tasks assessed/performed       Past Medical History:  Diagnosis Date  . Celiac disease   . Polycystic ovarian disease     No past surgical history on file.  There were no vitals filed for this visit.  Subjective Assessment - 09/12/17 1220    Subjective  I have been having more neurological symptoms and my doctor is going to look into it.  Going to see the doctor on the 19th.    Patient Stated Goals  get rid of pain, be able to have intercourse and return to exercise    Currently in Pain?  No/denies                      [facility] Adult PT Treatment/Exercise - 09/12/17 0001      Manual Therapy   Soft tissue mobilization  lumbar paraspinals, QL, hip flexors    Myofascial Release  thoracolumbar fascia       Trigger Point Dry Needling - 09/12/17 1747    Consent Given?  Yes    Education Handout Provided  Yes    Muscles Treated Upper Body  -- lumbar multifidi bilateral             PT Short Term Goals - 08/29/17 1120      PT SHORT TERM GOAL #1   Title  ind with initial HEP    Time  4    Period  Weeks    Status  Achieved      PT SHORT TERM GOAL #2   Title  be able to tolerate exam of levator ani and coccygeus muscles due to improved ability to relax pelvic floor    Time  4    Period  Weeks    Status  On-going        PT Long Term Goals -  08/22/17 1559      PT LONG TERM GOAL #1   Title  pt will report 1/3 on Marinoff scale    Time  16    Period  Weeks    Status  New    Target Date  12/12/17      PT LONG TERM GOAL #2   Title  pt will be able to identify comfortable positions for intercourse in order to manage pain and avoid flare ups    Time  16    Period  Weeks    Status  New    Target Date  12/12/17      PT LONG TERM GOAL #3   Title  pt will be able to return to normal exercise routine due to decreased pelvic and abdominal pain    Time  16    Period  Weeks    Status  New    Target  Date  12/12/17      PT LONG TERM GOAL #4   Title  pt will be ind with advanced HEP and self massage techniques in order to manage pain symptoms    Time  16    Period  Weeks    Status  New    Target Date  12/12/17      PT LONG TERM GOAL #5   Title  Pt will demonstrate 5/5 MMT bilateral hip adduction for improved pelvic stability during functional movement    Time  16    Period  Weeks    Status  New    Target Date  12/12/17            Plan - 09/12/17 1751    Clinical Impression Statement  Pt had good response from sof tissue release and dry needling.  She had twitch response along lumbar multifidi. Pt has muscle spasms on right hip flexors and rectus abdominis.  Pt will continue to benefit from skilled PT to address muscle spasms and immproved core strength for return to greater function.    PT Treatment/Interventions  ADLs/Self Care Home Management;Biofeedback;Cryotherapy;Electrical Stimulation;Moist Heat;Ultrasound;Functional mobility training;Therapeutic activities;Therapeutic exercise;Neuromuscular re-education;Patient/family education;Manual techniques;Passive range of motion;Dry needling;Taping    PT Next Visit Plan  f/u on response to dry needling, gentle core and hip strengthening, continue STM to Rt adductors, bilateral abdominis rectus, bilateral lumbar multifidi    Consulted and Agree with Plan of Care  Patient        Patient will benefit from skilled therapeutic intervention in order to improve the following deficits and impairments:  Pain, Increased fascial restricitons, Increased muscle spasms, Impaired tone, Decreased strength, Decreased range of motion, Impaired flexibility  Visit Diagnosis: No diagnosis found.     Problem List There are no active problems to display for this patient.   Zannie Cove, PT 09/12/2017, 6:00 PM  [facility] [address], [place], [place], [address] Phone: [phone]   Fax:  [phone]  Name: Lynise Porr MRN: [id number] Date of Birth: [birth year]  PHYSICAL THERAPY DISCHARGE SUMMARY  Visits from Start of Care: 3  Current functional level related to goals / functional outcomes: See above   Remaining deficits: See above details   Education / Equipment: See above  Plan: Patient agrees to discharge.  Patient goals were not met. Patient is being discharged due to not returning since the last visit.  ?????     Zannie Cove, PT 10/22/17 4:34 PM

## 2017-09-19 ENCOUNTER — Ambulatory Visit: Payer: BLUE CROSS/BLUE SHIELD | Admitting: Physical Therapy

## 2017-09-19 ENCOUNTER — Telehealth: Payer: Self-pay | Admitting: Physical Therapy

## 2017-09-19 NOTE — Telephone Encounter (Signed)
Patient did not show for appointment.  Patient was called and PT left message to please call us back.  Vincente PoliJakki Crosser, PT 09/19/17 10:56 AM

## 2017-09-26 ENCOUNTER — Ambulatory Visit: Payer: BLUE CROSS/BLUE SHIELD | Admitting: Physical Therapy

## 2017-09-26 ENCOUNTER — Telehealth: Payer: Self-pay | Admitting: Physical Therapy

## 2017-09-26 NOTE — Telephone Encounter (Signed)
Patient did not show for appointment.  Patient was called and PT left message to please call us back. Remaining appointments canceled due to 3rd no show.  Vincente PoliJakki Crosser, PT 09/26/17 10:48 AM

## 2017-10-03 ENCOUNTER — Encounter: Payer: BLUE CROSS/BLUE SHIELD | Admitting: Physical Therapy

## 2017-10-10 ENCOUNTER — Encounter: Payer: BLUE CROSS/BLUE SHIELD | Admitting: Physical Therapy

## 2017-10-17 ENCOUNTER — Encounter: Payer: BLUE CROSS/BLUE SHIELD | Admitting: Physical Therapy

## 2017-10-24 ENCOUNTER — Encounter: Payer: BLUE CROSS/BLUE SHIELD | Admitting: Physical Therapy

## 2017-11-20 ENCOUNTER — Other Ambulatory Visit: Payer: Self-pay

## 2017-11-20 ENCOUNTER — Ambulatory Visit (INDEPENDENT_AMBULATORY_CARE_PROVIDER_SITE_OTHER): Payer: BLUE CROSS/BLUE SHIELD | Admitting: Sports Medicine

## 2017-11-20 ENCOUNTER — Encounter: Payer: Self-pay | Admitting: Sports Medicine

## 2017-11-20 ENCOUNTER — Other Ambulatory Visit: Payer: Self-pay | Admitting: Sports Medicine

## 2017-11-20 ENCOUNTER — Ambulatory Visit (INDEPENDENT_AMBULATORY_CARE_PROVIDER_SITE_OTHER): Payer: BLUE CROSS/BLUE SHIELD

## 2017-11-20 VITALS — BP 99/63 | HR 86 | Resp 16

## 2017-11-20 DIAGNOSIS — M79671 Pain in right foot: Secondary | ICD-10-CM

## 2017-11-20 DIAGNOSIS — S92351A Displaced fracture of fifth metatarsal bone, right foot, initial encounter for closed fracture: Secondary | ICD-10-CM

## 2017-11-20 DIAGNOSIS — S99921A Unspecified injury of right foot, initial encounter: Secondary | ICD-10-CM | POA: Diagnosis not present

## 2017-11-20 DIAGNOSIS — S93601A Unspecified sprain of right foot, initial encounter: Secondary | ICD-10-CM | POA: Diagnosis not present

## 2017-11-20 NOTE — Progress Notes (Signed)
Subjective: Lori Beck is a 22 y.o. female patient who presents to office for evaluation of Right foot pain, Date of Injury 11/19/17 was out last night drinking and celebrating her graduation from [employer] when she took a misstep and sprained her foot and had intense pain 9/10 with walking and noticed her foot swelling and turning black and blue. Patient states that she has iced, and has elevated, and has taken motrin. Reports when sitting her pain is 2/10. Patient denies any other pedal complaints.   Review of Systems  Cardiovascular: Positive for leg swelling.  Musculoskeletal:       Right foot pain  Skin:       Bruise right foot    Patient Active Problem List   Diagnosis Date Noted  . Abnormal thyroid function test 01/03/2015  . Vitamin D deficiency 01/03/2015  . Celiac disease 07/03/2014    Current Outpatient Medications on File Prior to Visit  Medication Sig Dispense Refill  . etodolac (LODINE) 200 MG capsule Take 1 capsule (200 mg total) by mouth every 8 (eight) hours. 12 capsule 0  . fluconazole (DIFLUCAN) 150 MG tablet Take 1 tablet (150 mg total) by mouth daily. 1 tablet 1  . HYDROcodone-acetaminophen (NORCO/VICODIN) 5-325 MG per tablet Take 1 tablet by mouth every 4 (four) hours as needed. 10 tablet 0  . JUNEL 1.5/30 1.5-30 MG-MCG tablet Take 1 tablet by mouth at bedtime.  1  . metFORMIN (GLUCOPHAGE) 500 MG tablet Take 500 mg by mouth daily with breakfast.    . naproxen (NAPROSYN) 500 MG tablet Take 1 tablet (500 mg total) by mouth 2 (two) times daily. 30 tablet 0  . nitrofurantoin, macrocrystal-monohydrate, (MACROBID) 100 MG capsule Take 1 capsule (100 mg total) by mouth 2 (two) times daily. 10 capsule 0  . Norethin-Eth Estrad-Fe Biphas (LO LOESTRIN FE PO) Take 1 tablet by mouth daily.    . ondansetron (ZOFRAN ODT) 4 MG disintegrating tablet Take 1 tablet (4 mg total) by mouth every 8 (eight) hours as needed for nausea or vomiting. 7 tablet 0   No current  facility-administered medications on file prior to visit.     Allergies  Allergen Reactions  . Gluten Meal Other (See Comments)    Objective:  General: Alert and oriented x3 in no acute distress  Dermatology: No open lesions bilateral lower extremities, no webspace macerations, + ecchymosis right lateral foot and ankle, all nails x 10 are well manicured.  Vascular: Focal edema noted to right foot. Dorsalis Pedis and Posterior Tibial pedal pulses 2/4, Capillary Fill Time 3 seconds,(+) pedal hair growth bilateral, Temperature gradient within normal limits. Subjective cool feeling to foot.  Neurology: Gross sensation intact via light touch bilateral, Protective sensation intact with Semmes Weinstein Monofilament to all pedal sites, Deep tendon reflexes within normal limits bilateral, No babinski sign present bilateral. (- )Tinels sign right foot.   Musculoskeletal: There is tenderness with palpation at right 5th met base and lateral ankle. All joint range of motion is within normal limits except on right where there is pain and guarding of the involved area, Strength within normal limits in all groups bilateral except on right.   Gait: Unassisted, Antalgic gait  Xrays  Right Foot   Impression: Soft tissue swelling, transverse nondisplaced extraarticular fracture of base of 5th metatarsal, no other acute findings.     Assessment and Plan: Problem List Items Addressed This Visit    None    Visit Diagnoses    Closed fracture of base of  fifth metatarsal bone of right foot, initial encounter    -  Primary   Relevant Orders   DME Crutches   DME Other see comment   Foot sprain, right, initial encounter       Injury of right foot, initial encounter       Right foot pain          -Complete examination performed -Xrays reviewed -Discussed treatement options for fracture; risks, alternatives, and benefits explained. -Production assistant, radio to assist with swelling and immobilization of  fracture site on right foot to keep intact for 5 days  -Dispensed CAM walker to patient to wear at all times and instructed on use and Rx knee scooter to assist patient with limiting any pressure to right foot since fracture is at base of 5th met where there is surrounding tendon attachments and after sprain injury to foot -Recommend protection, rest, ice, elevation, and anti-inflammatories daily until symptoms improve -Rx Exogen for acute fracture to assist with faster healing for the patient  -Patient to return to office in 2 weeks for serial x-rays to assess healing  or sooner if condition worsens. Patient will be moving to [facility] June 10 and will be instructed to follow up with Dr. Bayard Males in [place] for continued fracture care.   Landis Martins, DPM

## 2017-11-20 NOTE — Patient Instructions (Signed)
SOFT CAST/ UNNA BOOT INSTRUCTIONS  Unna boot needs to be worn for 5 days for maximum benefit.  If you next appointment is before 5 days, please remove prior to coming in.  It is important that you wear sturdy shoe or walking boot at all times when walking to protect cast.    **If at any time while wearing the unna boot you should notice any irritation such as a rash, redness, or itching, remove the boot and wash your foot/feet thoroughly.  BATHING INSTRUCTIONS  Keep soft cast clean and dry. If wet, pat dry and blow dry with hair dryer and call office for further instructions.   

## 2017-11-21 ENCOUNTER — Telehealth: Payer: Self-pay | Admitting: *Deleted

## 2017-11-21 ENCOUNTER — Ambulatory Visit: Payer: Self-pay | Admitting: Podiatry

## 2017-11-21 NOTE — Telephone Encounter (Signed)
Left message informing Lori Beck - Exogen pt needed a Bone Growth Stimulator, clinicals and demographics would be available and DOI 11/19/2017.

## 2017-11-21 NOTE — Telephone Encounter (Signed)
Emailed request for knee scooter and faxed orders to [facility].

## 2017-11-29 ENCOUNTER — Telehealth: Payer: Self-pay | Admitting: Sports Medicine

## 2017-11-29 MED ORDER — TRAMADOL HCL 50 MG PO TABS: ORAL_TABLET | ORAL | 0 refills | Status: AC

## 2017-11-29 NOTE — Telephone Encounter (Signed)
Left message informing pt Dr. Marylene Land had ordered medication and it was sent to her pharmacy. Orders called to [id number].

## 2017-11-29 NOTE — Telephone Encounter (Signed)
I was in there two weeks ago for a broken foot. I'm still in a lot of pain, especially when I'm trying to sleep at night. It's really making it hard for me to sleep because I wake up in a lot of pain. So I was calling to see if there is anything I can get for the pain? I've been taking ibuprofen and it just really isn't helping as much as I need it to. You can reach me back at [phone]. Thank you. Bye.

## 2017-11-29 NOTE — Addendum Note (Signed)
Addended by: Alphia Kava D on: 11/29/2017 03:53 PM   Modules accepted: Orders

## 2017-11-29 NOTE — Telephone Encounter (Signed)
We can try Tramadol for her pain. Send 15 tabs to her pharmacy to take Q8h PRN pain Thanks Dr. Kathie Rhodes

## 2017-12-04 ENCOUNTER — Ambulatory Visit (INDEPENDENT_AMBULATORY_CARE_PROVIDER_SITE_OTHER): Payer: BLUE CROSS/BLUE SHIELD

## 2017-12-04 ENCOUNTER — Ambulatory Visit (INDEPENDENT_AMBULATORY_CARE_PROVIDER_SITE_OTHER): Payer: BLUE CROSS/BLUE SHIELD | Admitting: Sports Medicine

## 2017-12-04 ENCOUNTER — Encounter: Payer: Self-pay | Admitting: Sports Medicine

## 2017-12-04 DIAGNOSIS — S92351A Displaced fracture of fifth metatarsal bone, right foot, initial encounter for closed fracture: Secondary | ICD-10-CM

## 2017-12-04 DIAGNOSIS — M79671 Pain in right foot: Secondary | ICD-10-CM | POA: Diagnosis not present

## 2017-12-04 DIAGNOSIS — S99921D Unspecified injury of right foot, subsequent encounter: Secondary | ICD-10-CM | POA: Diagnosis not present

## 2017-12-04 DIAGNOSIS — S93601D Unspecified sprain of right foot, subsequent encounter: Secondary | ICD-10-CM | POA: Diagnosis not present

## 2017-12-04 MED ORDER — TRAMADOL HCL 50 MG PO TABS: 50.0000 mg | ORAL_TABLET | Freq: Three times a day (TID) | ORAL | 0 refills | Status: AC | PRN

## 2017-12-04 NOTE — Patient Instructions (Signed)
[email or web address]  Dr. Skeet SimmerKnowles make appointment on her website ([email or web address]) for follow up care while living in [place].

## 2017-12-04 NOTE — Progress Notes (Addendum)
Subjective: Lori Beck is a 22 y.o. female patient who returns to office for evaluation of Right foot pain, Date of Injury 11/19/17 after misstep with fracture of 5th metatarsal base with sprain. Reports when sitting her pain is 1/10. Tramadol. Patient denies any other pedal complaints.    Patient Active Problem List   Diagnosis Date Noted  . Abnormal thyroid function test 01/03/2015  . Vitamin D deficiency 01/03/2015  . Celiac disease 07/03/2014    Current Outpatient Medications on File Prior to Visit  Medication Sig Dispense Refill  . escitalopram (LEXAPRO) 20 MG tablet Take by mouth.    . etodolac (LODINE) 200 MG capsule Take 1 capsule (200 mg total) by mouth every 8 (eight) hours. 12 capsule 0  . fluconazole (DIFLUCAN) 150 MG tablet Take 1 tablet (150 mg total) by mouth daily. 1 tablet 1  . HYDROcodone-acetaminophen (NORCO/VICODIN) 5-325 MG per tablet Take 1 tablet by mouth every 4 (four) hours as needed. 10 tablet 0  . JUNEL 1.5/30 1.5-30 MG-MCG tablet Take 1 tablet by mouth at bedtime.  1  . lisdexamfetamine (VYVANSE) 20 MG capsule Take by mouth.    . metFORMIN (GLUCOPHAGE) 500 MG tablet Take 500 mg by mouth daily with breakfast.    . naproxen (NAPROSYN) 500 MG tablet Take 1 tablet (500 mg total) by mouth 2 (two) times daily. 30 tablet 0  . nitrofurantoin, macrocrystal-monohydrate, (MACROBID) 100 MG capsule Take 1 capsule (100 mg total) by mouth 2 (two) times daily. 10 capsule 0  . Norethin-Eth Estrad-Fe Biphas (LO LOESTRIN FE PO) Take 1 tablet by mouth daily.    . ondansetron (ZOFRAN ODT) 4 MG disintegrating tablet Take 1 tablet (4 mg total) by mouth every 8 (eight) hours as needed for nausea or vomiting. 7 tablet 0  . traMADol (ULTRAM) 50 MG tablet Take one tablet every 8 hours prn foot. 15 tablet 0   No current facility-administered medications on file prior to visit.     Allergies  Allergen Reactions  . Gluten Meal Other (See Comments)    Objective:  General: Alert  and oriented x3 in no acute distress  Dermatology: No open lesions bilateral lower extremities, no webspace macerations, + ecchymosis right lateral foot and ankle, all nails x 10 are well manicured.  Vascular: Focal edema noted to right foot. Dorsalis Pedis and Posterior Tibial pedal pulses 2/4, Capillary Fill Time 3 seconds,(+) pedal hair growth bilateral, Temperature gradient within normal limits. Subjective cool feeling to foot.  Neurology: Gross sensation intact via light touch bilateral, Protective sensation intact with Semmes Weinstein Monofilament to all pedal sites, Deep tendon reflexes within normal limits bilateral, No babinski sign present bilateral. (- )Tinels sign right foot.   Musculoskeletal: There is tenderness with palpation at right 5th met base and lateral ankle. All joint range of motion is within normal limits except on right where there is pain and guarding of the involved area, Strength within normal limits in all groups bilateral except on right.   Gait: Unassisted, Antalgic gait  Xrays  Right Foot   Impression: Soft tissue swelling, transverse nondisplaced extraarticular fracture of base of 5th metatarsal, minimal healing noted, no other acute findings.     Assessment and Plan: Problem List Items Addressed This Visit    None    Visit Diagnoses    Closed fracture of base of fifth metatarsal bone of right foot    -  Primary   Relevant Orders   DG Foot Complete Right (Completed)   Right foot  sprain, subsequent encounter       Injury of right foot, subsequent encounter       Right foot pain          -Complete examination performed -Re-Discussed treatement options for fracture; risks, alternatives, and benefits explained. -Xrays reviewed with transverse fracture at 5th met base -Continue with CAM walker to patient to wear at all times and knee scooter or crutches -Dispensed compression sleeve for edema control  -Refilled Tramadol for pain -Patient is awaiting  exogen  -Patient to follow up with Dr. Bayard Males in [place] for continued sprain/ fracture care.   Landis Martins, DPM

## 2017-12-31 ENCOUNTER — Telehealth: Payer: Self-pay | Admitting: Sports Medicine

## 2017-12-31 NOTE — Telephone Encounter (Signed)
I was in about a month ago for a broken foot. I'm calling to see if I can get my x-rays images either e-mailed to me or to the doctor I'm going to see later this afternoon to hopefully get a final check up of my foot. My number is [phone] if you can give me a call back. My e-mail address in case you don't have it on file is [email or web address]. Just give me a call or e-mail as soon as possible. Thanks. Bye.

## 2017-12-31 NOTE — Telephone Encounter (Signed)
Pt called back and asked me to e-mail her the medical records release form to [email or web address]. I told her once I got it back from her I would e-mail her requested x-rays to her as an attachment in an e-mail. Pt stated that was fine.

## 2017-12-31 NOTE — Telephone Encounter (Signed)
Tried calling pt back in regards to her x-rays. Left a voicemail to call me back at [phone] and let her know that she needs to fill out and sign a medical records release form her before I can release her x-rays to her. I let her know I would be at lunch from 12:15 - 1:15 today.
